# Patient Record
Sex: Female | Born: 1955 | Race: Black or African American | Hispanic: No | Marital: Married | Smoking: Never smoker
Health system: Southern US, Community
[De-identification: ages and names within clinical notes are randomized; demographics above are authoritative.]

## PROBLEM LIST (undated history)

## (undated) DIAGNOSIS — I10 Essential (primary) hypertension: Secondary | ICD-10-CM

## (undated) HISTORY — PX: NO PAST SURGERIES: SHX2092

## (undated) HISTORY — DX: Essential (primary) hypertension: I10

---

## 2016-12-11 ENCOUNTER — Encounter: Payer: Self-pay | Admitting: Nurse Practitioner

## 2016-12-11 ENCOUNTER — Ambulatory Visit (INDEPENDENT_AMBULATORY_CARE_PROVIDER_SITE_OTHER): Payer: BLUE CROSS/BLUE SHIELD | Admitting: Nurse Practitioner

## 2016-12-11 VITALS — BP 150/105 | HR 78 | Temp 98.8°F | Resp 16 | Ht 63.5 in | Wt 178.4 lb

## 2016-12-11 DIAGNOSIS — I10 Essential (primary) hypertension: Secondary | ICD-10-CM

## 2016-12-11 DIAGNOSIS — Z7689 Persons encountering health services in other specified circumstances: Secondary | ICD-10-CM | POA: Diagnosis not present

## 2016-12-11 DIAGNOSIS — M5441 Lumbago with sciatica, right side: Secondary | ICD-10-CM | POA: Diagnosis not present

## 2016-12-11 MED ORDER — AMLODIPINE BESYLATE 5 MG PO TABS: 5.0000 mg | ORAL_TABLET | Freq: Every day | ORAL | 3 refills | Status: DC

## 2016-12-11 MED ORDER — HYDROCHLOROTHIAZIDE 25 MG PO TABS: 25.0000 mg | ORAL_TABLET | Freq: Every day | ORAL | 3 refills | Status: DC

## 2016-12-11 NOTE — Patient Instructions (Signed)
Linda Guzman, Thank you for coming in to clinic today.  1. High blood pressure:   - START hydrochlorothiazide 25 mg once daily. - START amlodipine 5 mg once daily. - Continue eating similarly to the way you did in [place].   Use some tips from the DASH eating plan to help you know what foods might be good for eating if you can't eat your regular diet.  2. For your back pain: - Continue your current treatment for another 4 weeks. - If you continue to have pain, we can consider doing additional X-rays or MRI.   Please schedule a follow-up appointment with Cassell Smiles, AGNP in 4 weeks.  If you have any other questions or concerns, please feel free to call the clinic or send a message through [place]. You may also schedule an earlier appointment if necessary.  Cassell Smiles, DNP, AGNP-BC Adult Gerontology Nurse Practitioner [facility]    DASH Eating Plan DASH stands for "Dietary Approaches to Stop Hypertension." The DASH eating plan is a healthy eating plan that has been shown to reduce high blood pressure (hypertension). It may also reduce your risk for type 2 diabetes, heart disease, and stroke. The DASH eating plan may also help with weight loss. What are tips for following this plan? General guidelines   Avoid eating more than 2,300 mg (milligrams) of salt (sodium) a day. If you have hypertension, you may need to reduce your sodium intake to 1,500 mg a day.  Limit alcohol intake to no more than 1 drink a day for nonpregnant women and 2 drinks a day for men. One drink equals 12 oz of beer, 5 oz of wine, or 1 oz of hard liquor.  Work with your health care provider to maintain a healthy body weight or to lose weight. Ask what an ideal weight is for you.  Get at least 30 minutes of exercise that causes your heart to beat faster (aerobic exercise) most days of the week. Activities may include walking, swimming, or biking.  Work with your health care provider or diet  and nutrition specialist (dietitian) to adjust your eating plan to your individual calorie needs. Reading food labels   Check food labels for the amount of sodium per serving. Choose foods with less than 5 percent of the Daily Value of sodium. Generally, foods with less than 300 mg of sodium per serving fit into this eating plan.  To find whole grains, look for the word "whole" as the first word in the ingredient list. Shopping   Buy products labeled as "low-sodium" or "no salt added."  Buy fresh foods. Avoid canned foods and premade or frozen meals. Cooking   Avoid adding salt when cooking. Use salt-free seasonings or herbs instead of table salt or sea salt. Check with your health care provider or pharmacist before using salt substitutes.  Do not fry foods. Cook foods using healthy methods such as baking, boiling, grilling, and broiling instead.  Cook with heart-healthy oils, such as olive, canola, soybean, or sunflower oil. Meal planning    Eat a balanced diet that includes:  5 or more servings of fruits and vegetables each day. At each meal, try to fill half of your plate with fruits and vegetables.  Up to 6-8 servings of whole grains each day.  Less than 6 oz of lean meat, poultry, or fish each day. A 3-oz serving of meat is about the same size as a deck of cards. One egg equals 1 oz.  2 servings of low-fat dairy each day.  A serving of nuts, seeds, or beans 5 times each week.  Heart-healthy fats. Healthy fats called Omega-3 fatty acids are found in foods such as flaxseeds and coldwater fish, like sardines, salmon, and mackerel.  Limit how much you eat of the following:  Canned or prepackaged foods.  Food that is high in trans fat, such as fried foods.  Food that is high in saturated fat, such as fatty meat.  Sweets, desserts, sugary drinks, and other foods with added sugar.  Full-fat dairy products.  Do not salt foods before eating.  Try to eat at least 2  vegetarian meals each week.  Eat more home-cooked food and less restaurant, buffet, and fast food.  When eating at a restaurant, ask that your food be prepared with less salt or no salt, if possible. What foods are recommended? The items listed may not be a complete list. Talk with your dietitian about what dietary choices are best for you. Grains  Whole-grain or whole-wheat bread. Whole-grain or whole-wheat pasta. Brown rice. [place]. Bulgur. Whole-grain and low-sodium cereals. Pita bread. Low-fat, low-sodium crackers. Whole-wheat flour tortillas. Vegetables  Fresh or frozen vegetables (raw, steamed, roasted, or grilled). Low-sodium or reduced-sodium tomato and vegetable juice. Low-sodium or reduced-sodium tomato sauce and tomato paste. Low-sodium or reduced-sodium canned vegetables. Fruits  All fresh, dried, or frozen fruit. Canned fruit in natural juice (without added sugar). Meat and other protein foods  Skinless chicken or [place]. Ground chicken or [place]. Pork with fat trimmed off. Fish and seafood. Egg whites. Dried beans, peas, or lentils. Unsalted nuts, nut butters, and seeds. Unsalted canned beans. Lean cuts of beef with fat trimmed off. Low-sodium, lean deli meat. Dairy  Low-fat (1%) or fat-free (skim) milk. Fat-free, low-fat, or reduced-fat cheeses. Nonfat, low-sodium ricotta or cottage cheese. Low-fat or nonfat yogurt. Low-fat, low-sodium cheese. Fats and oils  Soft margarine without trans fats. Vegetable oil. Low-fat, reduced-fat, or light mayonnaise and salad dressings (reduced-sodium). Canola, safflower, olive, soybean, and sunflower oils. Avocado. Seasoning and other foods  Herbs. Spices. Seasoning mixes without salt. Unsalted popcorn and pretzels. Fat-free sweets. What foods are not recommended? The items listed may not be a complete list. Talk with your dietitian about what dietary choices are best for you. Grains  Baked goods made with fat, such as croissants,  muffins, or some breads. Dry pasta or rice meal packs. Vegetables  Creamed or fried vegetables. Vegetables in a cheese sauce. Regular canned vegetables (not low-sodium or reduced-sodium). Regular canned tomato sauce and paste (not low-sodium or reduced-sodium). Regular tomato and vegetable juice (not low-sodium or reduced-sodium). Angie Fava. Olives. Fruits  Canned fruit in a light or heavy syrup. Fried fruit. Fruit in cream or butter sauce. Meat and other protein foods  Fatty cuts of meat. Ribs. Fried meat. Berniece Salines. Sausage. Bologna and other processed lunch meats. Salami. Fatback. Hotdogs. Bratwurst. Salted nuts and seeds. Canned beans with added salt. Canned or smoked fish. Whole eggs or egg yolks. Chicken or [place] with skin. Dairy  Whole or 2% milk, cream, and half-and-half. Whole or full-fat cream cheese. Whole-fat or sweetened yogurt. Full-fat cheese. Nondairy creamers. Whipped toppings. Processed cheese and cheese spreads. Fats and oils  Butter. Stick margarine. Lard. Shortening. Ghee. Bacon fat. Tropical oils, such as coconut, palm kernel, or palm oil. Seasoning and other foods  Salted popcorn and pretzels. Onion salt, garlic salt, seasoned salt, table salt, and sea salt. Worcestershire sauce. Tartar sauce. Barbecue sauce. Teriyaki sauce. Soy sauce,  including reduced-sodium. Steak sauce. Canned and packaged gravies. Fish sauce. Oyster sauce. Cocktail sauce. Horseradish that you find on the shelf. Ketchup. Mustard. Meat flavorings and tenderizers. Bouillon cubes. Hot sauce and Tabasco sauce. Premade or packaged marinades. Premade or packaged taco seasonings. Relishes. Regular salad dressings. Where to find more information:  National Heart, Lung, and [place]: https://wilson-eaton.com/  American Heart Association: www.heart.org Summary  The DASH eating plan is a healthy eating plan that has been shown to reduce high blood pressure (hypertension). It may also reduce your risk for type 2  diabetes, heart disease, and stroke.  With the DASH eating plan, you should limit salt (sodium) intake to 2,300 mg a day. If you have hypertension, you may need to reduce your sodium intake to 1,500 mg a day.  When on the DASH eating plan, aim to eat more fresh fruits and vegetables, whole grains, lean proteins, low-fat dairy, and heart-healthy fats.  Work with your health care provider or diet and nutrition specialist (dietitian) to adjust your eating plan to your individual calorie needs. This information is not intended to replace advice given to you by your health care provider. Make sure you discuss any questions you have with your health care provider. Document Released: 07/25/2011 Document Revised: 07/29/2016 Document Reviewed: 07/29/2016 Elsevier Interactive Patient Education  2017 [employer].

## 2016-12-11 NOTE — Progress Notes (Signed)
Subjective:    Patient ID: Linda Guzman, female    DOB: Dec 23, 1955, 61 y.o.   MRN: [id number]  Linda Guzman is a 61 y.o. female presenting on 12/11/2016 for New Patient (Initial Visit) and Hypertension (Patient reports that she has a history of high BP, pt was taking medications in [place]. Patient reports she has been off medications for about 3 months. Patient was taking Amlodipine 10 mg daily and Bendroflomethiazide 2.5 mg daily.)  She is accompanied today by her husband Oti who will translate.  No language line or physical interpreter for patient's native language, [place].  HPI Hypertension Has had a eye doctor's visit and had an elevated BP there.  She had been taking medications in [place], but has not for the last 3 months.  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  In [place], she was taking bendroflumethiazide 2.5 mg daily and amlodipine 10 mg daily.  Some headache at times with higher BP.   sometimes palpitations.  Pt denies headache, lightheadedness, dizziness, changes in vision, chest tightness/pressure, palpitations, sudden loss of speech or loss of consiousness.  She participates in physical activity most of the day and is sedentary only about 1-2 hours per day.    Fall  2 weeks fell from bed. Urgent care in [place] and still has pain in her back and running down her R leg. SHarp, burning and shooting pain.  Gave vitamin D and magnesium for compression of the spine.  Pain is slowly improving, only has shooting pain when standing up after sitting for a while.     Past Medical History:  Diagnosis Date  . Hypertension    History reviewed. No pertinent surgical history. Social History   Social History  . Marital status: Married    Spouse name: N/A  . Number of children: N/A  . Years of education: N/A   Occupational History  . Not on file.   Social History Main Topics  . Smoking status:  Never Smoker  . Smokeless tobacco: Never Used  . Alcohol use No  . Drug use: No  . Sexual activity: Not on file   Other Topics Concern  . Not on file   Social History Narrative  . No narrative on file   History reviewed. No pertinent family history. No current outpatient prescriptions on file prior to visit.   No current facility-administered medications on file prior to visit.     Review of Systems Per HPI unless specifically indicated above     Objective:    BP (!) 157/86 (BP Location: Right Arm, Patient Position: Sitting, Cuff Size: Large)   Pulse 78   Temp 98.8 F (37.1 C)   Resp 16   Ht 5' 3.5" (1.613 m)   Wt 178 lb 6.4 oz (80.9 kg)   SpO2 99%   BMI 31.11 kg/m    Wt Readings from Last 3 Encounters:  12/11/16 178 lb 6.4 oz (80.9 kg)   BP recheck 150/105   Physical Exam  Constitutional: She is oriented to person, place, and time. She appears well-developed and well-nourished. No distress.  HENT:  Head: Normocephalic and atraumatic.  Mouth/Throat: Oropharynx is clear and moist.  Eyes: Conjunctivae are normal. Pupils are equal, round, and reactive to light.  Neck: Normal range of motion. Neck supple. No JVD present. No tracheal deviation present.  No carotid bruit  Cardiovascular: Normal rate, regular rhythm, S1 normal  and S2 normal.  PMI is not displaced.  Exam reveals no gallop.   Murmur heard.  Systolic murmur is present with a grade of 3/6  Holosystolic murmur auscultated over R 2nd intercostal space at midclavicular line  Pulmonary/Chest: Effort normal and breath sounds normal. No respiratory distress.  Musculoskeletal: Normal range of motion.       Lumbar back: She exhibits bony tenderness. She exhibits normal range of motion, no swelling, no edema, no deformity, no laceration and no spasm.  Bony tenderness near L5-S1  Lymphadenopathy:    She has no cervical adenopathy.  Neurological: She is alert and oriented to person, place, and time.  Skin: Skin  is warm and dry.  Psychiatric: She has a normal mood and affect. Her behavior is normal. Judgment and thought content normal.      Assessment & Plan:   Problem List Items Addressed This Visit    None    Visit Diagnoses    Essential hypertension    -  Primary Uncontrolled r/t no access to medications in [place].  Plan: 1. Restart amlodipine at 5 mg take one tablet daily. 2. Restart thiazide diuretic from bendroflumethiazide 2.5 mg daily to hydrochlorothiazide 25 mg once daily for similar BP lowering effect.  HCTZ may provide slightly less lowering effect at this dose.  Recheck in 4 weeks.  Consider transition to chlorthalidone at that time if still uncontrolled BP. 3. CMP today for baseline kidney and liver function and electrolytes.   Relevant Medications   hydrochlorothiazide (HYDRODIURIL) 25 MG tablet   amLODipine (NORVASC) 5 MG tablet   Other Relevant Orders   COMPLETE METABOLIC PANEL WITH GFR   Acute midline low back pain with right-sided sciatica    Slowly improving.  Patient with full ROM.  Only experiences sciatica when standing from a chair after a prolonged period of siting.  Plan: 1. Continue Vitamin D and Magnesium supplements. 2. If pain persists or worsens after another 4 weeks, consider re-imaging.      Encounter to establish care     Patient relocating from [place] to [place].  Known medical history of hypertension.  Plan: 1. Manage HTN and participate in preventive care as needed.      Meds ordered this encounter  Medications  . cholecalciferol (VITAMIN D) 1000 units tablet    Sig: Take 1,000 Units by mouth daily.  . magnesium oxide (MAG-OX) 400 MG tablet    Sig: Take 400 mg by mouth daily.  . hydrochlorothiazide (HYDRODIURIL) 25 MG tablet    Sig: Take 1 tablet (25 mg total) by mouth daily.    Dispense:  90 tablet    Refill:  3    Order Specific Question:   Supervising Provider    Answer:   Olin Hauser [[id number]]  . amLODipine (NORVASC) 5 MG tablet     Sig: Take 1 tablet (5 mg total) by mouth daily.    Dispense:  90 tablet    Refill:  3    Order Specific Question:   Supervising Provider    Answer:   Olin Hauser [[id number]]     Follow up plan: Return in about 4 weeks (around 01/08/2017) for blood pressure - new medicine.  Cassell Smiles, DNP, AGPCNP-BC Adult Gerontology Primary Care Nurse Practitioner [facility] 12/11/2016, 10:32 AM

## 2016-12-11 NOTE — Progress Notes (Signed)
I have reviewed this encounter including the documentation in this note and/or discussed this patient with the provider, Cassell Smiles, AGPCNP-BC. I am certifying that I agree with the content of this note as supervising physician.  Nobie Putnam, [facility] 12/11/2016, 5:35 PM

## 2016-12-12 ENCOUNTER — Telehealth: Payer: Self-pay

## 2016-12-12 LAB — COMPLETE METABOLIC PANEL WITH GFR
AG Ratio: 1.4 Ratio (ref 1.0–2.5)
ALT: 20 U/L (ref 6–29)
AST: 27 U/L (ref 10–35)
Albumin: 4.3 g/dL (ref 3.6–5.1)
Alkaline Phosphatase: 83 U/L (ref 33–130)
BUN/Creatinine Ratio: 15.4 Ratio (ref 6–22)
BUN: 14 mg/dL (ref 7–25)
CO2: 25 mmol/L (ref 20–31)
Calcium: 9.4 mg/dL (ref 8.6–10.4)
Chloride: 104 mmol/L (ref 98–110)
Creat: 0.91 mg/dL (ref 0.50–0.99)
GFR, Est African American: 79 mL/min (ref >=60)
GFR, Est Non African American: 69 mL/min (ref >=60)
Globulin: 3.1 g/dL (ref 1.9–3.7)
Glucose, Bld: 127 mg/dL — ABNORMAL HIGH (ref 65–99)
Potassium: 3.5 mmol/L (ref 3.5–5.3)
Sodium: 141 mmol/L (ref 135–146)
Total Bilirubin: 0.5 mg/dL (ref 0.2–1.2)
Total Protein: 7.4 g/dL (ref 6.1–8.1)

## 2016-12-12 NOTE — Telephone Encounter (Signed)
-----   Message from Mikey College, NP sent at 12/12/2016 11:23 AM EDT ----- Unsure of fasting vs. Non-fasting labs. If fasting labs, glucose is elevated and patient is at high risk for T2DM.  If non-fasting, this value is normal. Kidney function and electrolytes are normal.   At next appointment we will check Hgb A1c for diabetes screening and repeat CMP to reassess kidney function after starting hydrochlorothiazide.

## 2016-12-12 NOTE — Telephone Encounter (Signed)
Patient's husband advised of lab results. Patient verbalizes understanding and is in agreement with treatment plan.  Patient's husband reports that pt was not fasting for labs.

## 2017-01-08 ENCOUNTER — Encounter: Payer: Self-pay | Admitting: Nurse Practitioner

## 2017-01-08 ENCOUNTER — Ambulatory Visit (INDEPENDENT_AMBULATORY_CARE_PROVIDER_SITE_OTHER): Payer: BLUE CROSS/BLUE SHIELD | Admitting: Nurse Practitioner

## 2017-01-08 VITALS — BP 138/88 | HR 74 | Temp 98.7°F | Ht 64.5 in | Wt 175.6 lb

## 2017-01-08 DIAGNOSIS — I10 Essential (primary) hypertension: Secondary | ICD-10-CM | POA: Diagnosis not present

## 2017-01-08 DIAGNOSIS — R739 Hyperglycemia, unspecified: Secondary | ICD-10-CM | POA: Insufficient documentation

## 2017-01-08 LAB — COMPREHENSIVE METABOLIC PANEL
ALT: 19 U/L (ref 6–29)
AST: 21 U/L (ref 10–35)
Albumin: 4.1 g/dL (ref 3.6–5.1)
Alkaline Phosphatase: 84 U/L (ref 33–130)
BUN: 9 mg/dL (ref 7–25)
CO2: 27 mmol/L (ref 20–31)
Calcium: 9.6 mg/dL (ref 8.6–10.4)
Chloride: 102 mmol/L (ref 98–110)
Creat: 0.9 mg/dL (ref 0.50–0.99)
Glucose, Bld: 92 mg/dL (ref 65–99)
Potassium: 3.6 mmol/L (ref 3.5–5.3)
Sodium: 140 mmol/L (ref 135–146)
Total Bilirubin: 0.6 mg/dL (ref 0.2–1.2)
Total Protein: 7.5 g/dL (ref 6.1–8.1)

## 2017-01-08 LAB — LIPID PANEL
Cholesterol: 201 mg/dL — ABNORMAL HIGH (ref <200)
HDL: 64 mg/dL (ref >50)
LDL Cholesterol: 117 mg/dL — ABNORMAL HIGH (ref <100)
Total CHOL/HDL Ratio: 3.1 Ratio (ref <5.0)
Triglycerides: 99 mg/dL (ref <150)
VLDL: 20 mg/dL (ref <30)

## 2017-01-08 LAB — POCT GLYCOSYLATED HEMOGLOBIN (HGB A1C): Hemoglobin A1C: 5.9

## 2017-01-08 MED ORDER — HYDROCHLOROTHIAZIDE 50 MG PO TABS: 50.0000 mg | ORAL_TABLET | Freq: Every day | ORAL | 3 refills | Status: DC

## 2017-01-08 NOTE — Progress Notes (Signed)
Subjective:    Patient ID: [name], female    DOB: [birth year], 61 y.o.   MRN: [id number]  Lourine Alberico is a 61 y.o. female presenting on 01/08/2017 for Hypertension  Ms. Cashaw has improved understanding of English.  Her husband, Oti, is also present in visit and is translating some.  Ms. Brasher is contributing much of her history without language assistance.  [place] is not available by interpreter line or in-person interpreter.  HPI Hypertension At last visit, Ms. Olinde was restarted on amlodipine at 5 mg once daily and changed from bendroflumethiazide 2.5 mg once daily to hydrochlorothiazide 25 mg once daily for similar BP lowering effect.    Since that visit, she admits only occasional palpitations.  Pt denies headache, lightheadedness, dizziness, changes in vision, chest tightness/pressure, leg swelling, sudden loss of speech or loss of consiousness.   Elevated fasting glucose Fasting glucose was 127 on CMP in April 2018.  Pt denies polydipsia, polyphagia, polyuria, headaches, diaphoresis, shakiness, chills, pain, numbness or tingling in extremities, and changes in vision.  She has started adapting well to her life in the [place].  She is still eating oatmeal daily instead of her usual porridge and has transitioned to white potatoes here.  She is preparing most food at home and she and her husband eat many fresh vegetables.    They request information about what foods are good to eat for blood sugar and blood pressure.   Social History  Substance Use Topics  . Smoking status: Never Smoker  . Smokeless tobacco: Never Used  . Alcohol use No    Review of Systems Per HPI unless specifically indicated above     Objective:    BP (!) 142/91   Pulse 74   Temp 98.7 F (37.1 C) (Oral)   Ht 5' 4.5" (1.638 m)   Wt 175 lb 9.6 oz (79.7 kg)   BMI 29.68 kg/m    BP Recheck: 138/88  Wt Readings from Last 3 Encounters:  01/08/17 175 lb 9.6 oz (79.7 kg)  12/11/16 178 lb 6.4 oz (80.9 kg)     Physical Exam  Constitutional: She is oriented to person, place, and time. She appears well-developed and well-nourished. No distress.  HENT:  Head: Normocephalic and atraumatic.  Eyes: Conjunctivae are normal. Pupils are equal, round, and reactive to light.  Neck: Normal range of motion. Neck supple. No JVD present. No tracheal deviation present. No thyromegaly present.  Cardiovascular: Normal rate, regular rhythm, normal heart sounds and intact distal pulses.   Pulmonary/Chest: Effort normal and breath sounds normal.  Abdominal: Soft. Bowel sounds are normal. She exhibits no distension and no mass. There is no tenderness.  Lymphadenopathy:    She has no cervical adenopathy.  Neurological: She is alert and oriented to person, place, and time.  Skin: Skin is warm and dry.  Psychiatric: She has a normal mood and affect. Her behavior is normal. Judgment and thought content normal.  Vitals reviewed.  Results for orders placed or performed in visit on 01/08/17  Comprehensive metabolic panel  Result Value Ref Range   Sodium 140 135 - 146 mmol/L   Potassium 3.6 3.5 - 5.3 mmol/L   Chloride 102 98 - 110 mmol/L   CO2 27 20 - 31 mmol/L   Glucose, Bld 92 65 - 99 mg/dL   BUN 9 7 - 25 mg/dL   Creat 0.90 0.50 - 0.99 mg/dL   Total Bilirubin 0.6 0.2 - 1.2 mg/dL   Alkaline Phosphatase 84 33 -  130 U/L   AST 21 10 - 35 U/L   ALT 19 6 - 29 U/L   Total Protein 7.5 6.1 - 8.1 g/dL   Albumin 4.1 3.6 - 5.1 g/dL   Calcium 9.6 8.6 - 10.4 mg/dL  Lipid panel  Result Value Ref Range   Cholesterol 201 (H) <200 mg/dL   Triglycerides 99 <150 mg/dL   HDL 64 >50 mg/dL   Total CHOL/HDL Ratio 3.1 <5.0 Ratio   VLDL 20 <30 mg/dL   LDL Cholesterol 117 (H) <100 mg/dL  POCT glycosylated hemoglobin (Hb A1C)  Result Value Ref Range   Hemoglobin A1C 5.9       Assessment & Plan:   Problem List Items Addressed This Visit      Cardiovascular and Mediastinum   Essential hypertension - Primary    BP improved  after restarting medications.  Not yet at goal.  BP elevated in clinic.  Plan: 1. Increase hydrochlorothiazide to 50 mg once daily. 2. CONTINUE amlodipine 5 mg once daily. 3. Recheck CMP on medications.  Normal kidney and liver function without change on medications. 4. Check lipid panel assess cardiovascular risk.       Relevant Medications   hydrochlorothiazide (HYDRODIURIL) 50 MG tablet   Other Relevant Orders   Comprehensive metabolic panel (Completed)   Lipid panel (Completed)     Other   Blood glucose elevated    Single elevated blood glucose reading at last lab check.  Pt without symptoms of hyperglycemia.  Plan: 1. POCT hgb A1c today.   2. Check labs for cmp and lipids. 3. Discussed diet, dietary fiber, and glycemic index.  Encouraged high fiber and return to yams/sweet potatoes instead of white potatoes, smaller bowls of oatmeal. Provided handouts for dietary fiber and glycemic index for review.      Relevant Orders   Comprehensive metabolic panel (Completed)   Lipid panel (Completed)   POCT glycosylated hemoglobin (Hb A1C) (Completed)      Meds ordered this encounter  Medications  . hydrochlorothiazide (HYDRODIURIL) 50 MG tablet    Sig: Take 1 tablet (50 mg total) by mouth daily.    Dispense:  90 tablet    Refill:  3    Order Specific Question:   Supervising Provider    Answer:   Olin Hauser [[id number]]     Follow up plan: Return in about 3 months (around 04/10/2017) for hypertension and in 2 weeks for CMA BP check.   Cassell Smiles, DNP, AGPCNP-BC Adult Gerontology Primary Care Nurse Practitioner [facility] 01/08/2017, 8:02 AM

## 2017-01-08 NOTE — Patient Instructions (Addendum)
[facility], Thank you for coming in to clinic today.  1. Your blood pressures look much better today!  Great work! - We are still above goal.  -START taking hydrochlorothiazide 50 mg once daily.  Until you refill your medicine, you can take 2 pills once each day.  Your new refill will be the correct strength, so only take 1 pill. -CONTINUE your amlodipine 5 mg once daily.  2. For your elevated blood sugars tell me you are at risk for developing diabetes. - Your A1c is 5.9% today.  This means your average blood sugar is 133.  We want it less than 120 for fasting blood sugars and yours was 127 at your last check.  If you eat more dietary fiber, less sugar and refined carbohydrates, and stay active with exercise, you can lower your risk of developing diabetes. - Work to lose 1/2 lb to 1 lb per week.  For a total of 5-10 lbs of weight loss.   Call and ask your insurance company if nutrition visit is covered.  They may pay for it with elevated glucose readings or high blood pressure  Please schedule a follow-up appointment with Cassell Smiles, AGNP to Return in about 3 months (around 04/10/2017) for hypertension and in 2 weeks for CMA BP check.  If you have any other questions or concerns, please feel free to call the clinic or send a message through [place]. You may also schedule an earlier appointment if necessary.  Cassell Smiles, DNP, AGNP-BC Adult Gerontology Nurse Practitioner [facility], [facility]    DASH Eating Plan DASH stands for "Dietary Approaches to Stop Hypertension." The DASH eating plan is a healthy eating plan that has been shown to reduce high blood pressure (hypertension). It may also reduce your risk for type 2 diabetes, heart disease, and stroke. The DASH eating plan may also help with weight loss. What are tips for following this plan? General guidelines   Avoid eating more than 2,300 mg (milligrams) of salt (sodium) a day. If you have hypertension, you may need to  reduce your sodium intake to 1,500 mg a day.  Limit alcohol intake to no more than 1 drink a day for nonpregnant women and 2 drinks a day for men. One drink equals 12 oz of beer, 5 oz of wine, or 1 oz of hard liquor.  Work with your health care provider to maintain a healthy body weight or to lose weight. Ask what an ideal weight is for you.  Get at least 30 minutes of exercise that causes your heart to beat faster (aerobic exercise) most days of the week. Activities may include walking, swimming, or biking.  Work with your health care provider or diet and nutrition specialist (dietitian) to adjust your eating plan to your individual calorie needs. Reading food labels   Check food labels for the amount of sodium per serving. Choose foods with less than 5 percent of the Daily Value of sodium. Generally, foods with less than 300 mg of sodium per serving fit into this eating plan.  To find whole grains, look for the word "whole" as the first word in the ingredient list. Shopping   Buy products labeled as "low-sodium" or "no salt added."  Buy fresh foods. Avoid canned foods and premade or frozen meals. Cooking   Avoid adding salt when cooking. Use salt-free seasonings or herbs instead of table salt or sea salt. Check with your health care provider or pharmacist before using salt substitutes.  Do  not fry foods. Cook foods using healthy methods such as baking, boiling, grilling, and broiling instead.  Cook with heart-healthy oils, such as olive, canola, soybean, or sunflower oil. Meal planning    Eat a balanced diet that includes:  5 or more servings of fruits and vegetables each day. At each meal, try to fill half of your plate with fruits and vegetables.  Up to 6-8 servings of whole grains each day.  Less than 6 oz of lean meat, poultry, or fish each day. A 3-oz serving of meat is about the same size as a deck of cards. One egg equals 1 oz.  2 servings of low-fat dairy each  day.  A serving of nuts, seeds, or beans 5 times each week.  Heart-healthy fats. Healthy fats called Omega-3 fatty acids are found in foods such as flaxseeds and coldwater fish, like sardines, salmon, and mackerel.  Limit how much you eat of the following:  Canned or prepackaged foods.  Food that is high in trans fat, such as fried foods.  Food that is high in saturated fat, such as fatty meat.  Sweets, desserts, sugary drinks, and other foods with added sugar.  Full-fat dairy products.  Do not salt foods before eating.  Try to eat at least 2 vegetarian meals each week.  Eat more home-cooked food and less restaurant, buffet, and fast food.  When eating at a restaurant, ask that your food be prepared with less salt or no salt, if possible. What foods are recommended? The items listed may not be a complete list. Talk with your dietitian about what dietary choices are best for you. Grains  Whole-grain or whole-wheat bread. Whole-grain or whole-wheat pasta. Brown rice. [place]. Bulgur. Whole-grain and low-sodium cereals. Pita bread. Low-fat, low-sodium crackers. Whole-wheat flour tortillas. Vegetables  Fresh or frozen vegetables (raw, steamed, roasted, or grilled). Low-sodium or reduced-sodium tomato and vegetable juice. Low-sodium or reduced-sodium tomato sauce and tomato paste. Low-sodium or reduced-sodium canned vegetables. Fruits  All fresh, dried, or frozen fruit. Canned fruit in natural juice (without added sugar). Meat and other protein foods  Skinless chicken or [place]. Ground chicken or [place]. Pork with fat trimmed off. Fish and seafood. Egg whites. Dried beans, peas, or lentils. Unsalted nuts, nut butters, and seeds. Unsalted canned beans. Lean cuts of beef with fat trimmed off. Low-sodium, lean deli meat. Dairy  Low-fat (1%) or fat-free (skim) milk. Fat-free, low-fat, or reduced-fat cheeses. Nonfat, low-sodium ricotta or cottage cheese. Low-fat or nonfat yogurt.  Low-fat, low-sodium cheese. Fats and oils  Soft margarine without trans fats. Vegetable oil. Low-fat, reduced-fat, or light mayonnaise and salad dressings (reduced-sodium). Canola, safflower, olive, soybean, and sunflower oils. Avocado. Seasoning and other foods  Herbs. Spices. Seasoning mixes without salt. Unsalted popcorn and pretzels. Fat-free sweets. What foods are not recommended? The items listed may not be a complete list. Talk with your dietitian about what dietary choices are best for you. Grains  Baked goods made with fat, such as croissants, muffins, or some breads. Dry pasta or rice meal packs. Vegetables  Creamed or fried vegetables. Vegetables in a cheese sauce. Regular canned vegetables (not low-sodium or reduced-sodium). Regular canned tomato sauce and paste (not low-sodium or reduced-sodium). Regular tomato and vegetable juice (not low-sodium or reduced-sodium). Angie Fava. Olives. Fruits  Canned fruit in a light or heavy syrup. Fried fruit. Fruit in cream or butter sauce. Meat and other protein foods  Fatty cuts of meat. Ribs. Fried meat. Berniece Salines. Sausage. Bologna and other processed lunch  meats. Salami. Fatback. Hotdogs. Bratwurst. Salted nuts and seeds. Canned beans with added salt. Canned or smoked fish. Whole eggs or egg yolks. Chicken or [place] with skin. Dairy  Whole or 2% milk, cream, and half-and-half. Whole or full-fat cream cheese. Whole-fat or sweetened yogurt. Full-fat cheese. Nondairy creamers. Whipped toppings. Processed cheese and cheese spreads. Fats and oils  Butter. Stick margarine. Lard. Shortening. Ghee. Bacon fat. Tropical oils, such as coconut, palm kernel, or palm oil. Seasoning and other foods  Salted popcorn and pretzels. Onion salt, garlic salt, seasoned salt, table salt, and sea salt. Worcestershire sauce. Tartar sauce. Barbecue sauce. Teriyaki sauce. Soy sauce, including reduced-sodium. Steak sauce. Canned and packaged gravies. Fish sauce. Oyster sauce.  Cocktail sauce. Horseradish that you find on the shelf. Ketchup. Mustard. Meat flavorings and tenderizers. Bouillon cubes. Hot sauce and Tabasco sauce. Premade or packaged marinades. Premade or packaged taco seasonings. Relishes. Regular salad dressings. Where to find more information:  National Heart, Lung, and [place]: https://wilson-eaton.com/  American Heart Association: www.heart.org Summary  The DASH eating plan is a healthy eating plan that has been shown to reduce high blood pressure (hypertension). It may also reduce your risk for type 2 diabetes, heart disease, and stroke.  With the DASH eating plan, you should limit salt (sodium) intake to 2,300 mg a day. If you have hypertension, you may need to reduce your sodium intake to 1,500 mg a day.  When on the DASH eating plan, aim to eat more fresh fruits and vegetables, whole grains, lean proteins, low-fat dairy, and heart-healthy fats.  Work with your health care provider or diet and nutrition specialist (dietitian) to adjust your eating plan to your individual calorie needs. This information is not intended to replace advice given to you by your health care provider. Make sure you discuss any questions you have with your health care provider. Document Released: 07/25/2011 Document Revised: 07/29/2016 Document Reviewed: 07/29/2016 Elsevier Interactive Patient Education  2017 [place].    High-Fiber Diet Fiber, also called dietary fiber, is a type of carbohydrate found in fruits, vegetables, whole grains, and beans. A high-fiber diet can have many health benefits. Your health care provider may recommend a high-fiber diet to help:  Prevent constipation. Fiber can make your bowel movements more regular.  Lower your cholesterol.  Relieve hemorrhoids, uncomplicated diverticulosis, or irritable bowel syndrome.  Prevent overeating as part of a weight-loss plan.  Prevent heart disease, type 2 diabetes, and certain cancers. What  is my plan? The recommended daily intake of fiber includes:  38 grams for men under age 63.  9 grams for men over age 21.  39 grams for women under age 31.  35 grams for women over age 20. You can get the recommended daily intake of dietary fiber by eating a variety of fruits, vegetables, grains, and beans. Your health care provider may also recommend a fiber supplement if it is not possible to get enough fiber through your diet. What do I need to know about a high-fiber diet?  Fiber supplements have not been widely studied for their effectiveness, so it is better to get fiber through food sources.  Always check the fiber content on thenutrition facts label of any prepackaged food. Look for foods that contain at least 5 grams of fiber per serving.  Ask your dietitian if you have questions about specific foods that are related to your condition, especially if those foods are not listed in the following section.  Increase your daily fiber consumption  gradually. Increasing your intake of dietary fiber too quickly may cause bloating, cramping, or gas.  Drink plenty of water. Water helps you to digest fiber. What foods can I eat? Grains  Whole-grain breads. Multigrain cereal. Oats and oatmeal. Brown rice. Barley. Bulgur wheat. Solis. Bran muffins. Popcorn. Rye wafer crackers. Vegetables  Sweet potatoes. Spinach. Kale. Artichokes. Cabbage. Broccoli. Green peas. Carrots. Squash. Fruits  Berries. Pears. Apples. Oranges. Avocados. Prunes and raisins. Dried figs. Meats and Other Protein Sources  [place], kidney, pinto, and soy beans. Split peas. Lentils. Nuts and seeds. Dairy  Fiber-fortified yogurt. Beverages  Fiber-fortified soy milk. Fiber-fortified orange juice. Other  Fiber bars. The items listed above may not be a complete list of recommended foods or beverages. Contact your dietitian for more options.  What foods are not recommended? Grains  White bread. Pasta made with refined  flour. White rice. Vegetables  Fried potatoes. Canned vegetables. Well-cooked vegetables. Fruits  Fruit juice. Cooked, strained fruit. Meats and Other Protein Sources  Fatty cuts of meat. Fried Sales executive or fried fish. Dairy  Milk. Yogurt. Cream cheese. Sour cream. Beverages  Soft drinks. Other  Cakes and pastries. Butter and oils. The items listed above may not be a complete list of foods and beverages to avoid. Contact your dietitian for more information.  What are some tips for including high-fiber foods in my diet?  Eat a wide variety of high-fiber foods.  Make sure that half of all grains consumed each day are whole grains.  Replace breads and cereals made from refined flour or white flour with whole-grain breads and cereals.  Replace white rice with brown rice, quinoa, bulgur wheat, farro, or millet.  Start the day with a breakfast that is high in fiber, such as a cereal that contains at least 5 grams of fiber per serving.  Use beans in place of meat in soups, salads, or pasta.  Eat high-fiber snacks, such as berries, raw vegetables, nuts, or popcorn. This information is not intended to replace advice given to you by your health care provider. Make sure you discuss any questions you have with your health care provider. Document Released: 08/05/2005 Document Revised: 01/11/2016 Document Reviewed: 01/18/2014 Elsevier Interactive Patient Education  2017 [employer].

## 2017-01-09 NOTE — Assessment & Plan Note (Addendum)
Single elevated blood glucose reading at last lab check.  Pt without symptoms of hyperglycemia.  Plan: 1. POCT hgb A1c today.   2. Check labs for cmp and lipids. 3. Discussed diet, dietary fiber, and glycemic index.  Encouraged high fiber and return to yams/sweet potatoes instead of white potatoes, smaller bowls of oatmeal. Provided handouts for dietary fiber and glycemic index for review.

## 2017-01-09 NOTE — Assessment & Plan Note (Signed)
BP improved after restarting medications.  Not yet at goal.  BP elevated in clinic.  Plan: 1. Increase hydrochlorothiazide to 50 mg once daily. 2. CONTINUE amlodipine 5 mg once daily. 3. Recheck CMP on medications.  Normal kidney and liver function without change on medications. 4. Check lipid panel assess cardiovascular risk.

## 2017-01-10 NOTE — Progress Notes (Signed)
I have reviewed this encounter including the documentation in this note and/or discussed this patient with the provider, Cassell Smiles, AGPCNP-BC. I am certifying that I agree with the content of this note as supervising physician.  Nobie Putnam, DO [facility] 01/10/2017, 10:46 AM

## 2017-01-22 ENCOUNTER — Ambulatory Visit: Payer: BLUE CROSS/BLUE SHIELD

## 2017-01-22 VITALS — BP 144/84 | HR 70 | Resp 16 | Ht 64.5 in | Wt 175.0 lb

## 2017-01-22 DIAGNOSIS — I10 Essential (primary) hypertension: Secondary | ICD-10-CM

## 2017-03-03 ENCOUNTER — Ambulatory Visit: Payer: BLUE CROSS/BLUE SHIELD | Admitting: Nurse Practitioner

## 2017-03-03 ENCOUNTER — Encounter: Payer: Self-pay | Admitting: Nurse Practitioner

## 2017-03-03 ENCOUNTER — Ambulatory Visit (INDEPENDENT_AMBULATORY_CARE_PROVIDER_SITE_OTHER): Payer: BLUE CROSS/BLUE SHIELD | Admitting: Nurse Practitioner

## 2017-03-03 VITALS — BP 112/77 | HR 72 | Ht 64.5 in | Wt 174.8 lb

## 2017-03-03 DIAGNOSIS — I10 Essential (primary) hypertension: Secondary | ICD-10-CM | POA: Diagnosis not present

## 2017-03-03 DIAGNOSIS — R0789 Other chest pain: Secondary | ICD-10-CM | POA: Diagnosis not present

## 2017-03-03 NOTE — Assessment & Plan Note (Signed)
Improved and at goal. Pt is not checking BP at home, but is taking medications as prescribed w/o side effects.  Plan: 1. CONTINUE hydrochlorothiazide 50 mg once daily. 2. CONTINUE amlodipine 5 mg once daily. 3. Continue DASH eating plan and regular physical activity. 4. Follow up 3 months.

## 2017-03-03 NOTE — Progress Notes (Signed)
I have reviewed this encounter including the documentation in this note and/or discussed this patient with the provider, Cassell Smiles, AGPCNP-BC. I am certifying that I agree with the content of this note as supervising physician.  Nobie Putnam, [facility] 03/03/2017, 1:04 PM

## 2017-03-03 NOTE — Patient Instructions (Addendum)
Linda Guzman, Thank you for coming in to clinic today.  1. For your chest wall pain: - Start taking acetaminophen or Tylenol extra strength 1 to 2 tablets every 8 hours for aches.  Do not take more than 3,000 mg in 24 hours from all medicines.   - Use heat and ice.  Apply this for 15 minutes at a time 6-8 times per day.   - Muscle rub with lidocaine.  Avoid using this with heat and ice to avoid burns. Brands: BenGay, Aspercreme or a drugstore generic   2. For your blood pressure:  GREAT WORK!! - Continue your DASH diet. - Continue taking your medications.  Please schedule a follow-up appointment with Cassell Smiles, AGNP to Return in about 3 months (around 06/03/2017) for Blood pressure and in 1-2 weeks as needed for muscle pain.  If you have any other questions or concerns, please feel free to call the clinic or send a message through [place]. You may also schedule an earlier appointment if necessary.  Cassell Smiles, DNP, AGNP-BC Adult Gerontology Nurse Practitioner [facility], [facility]    Chest Wall Pain Chest wall pain is pain in or around the bones and muscles of your chest. Sometimes, an injury causes this pain. Sometimes, the cause may not be known. This pain may take several weeks or longer to get better. Follow these instructions at home: Pay attention to any changes in your symptoms. Take these actions to help with your pain:  Rest as told by your health care provider.  Avoid activities that cause pain. These include any activities that use your chest muscles or your abdominal and side muscles to lift heavy items.  If directed, apply ice to the painful area: ? Put ice in a plastic bag. ? Place a towel between your skin and the bag. ? Leave the ice on for 20 minutes, 2-3 times per day.  Take over-the-counter and prescription medicines only as told by your health care provider.  Do not use tobacco products, including cigarettes, chewing tobacco, and e-cigarettes.  If you need help quitting, ask your health care provider.  Keep all follow-up visits as told by your health care provider. This is important.  Contact a health care provider if:  You have a fever.  Your chest pain becomes worse.  You have new symptoms. Get help right away if:  You have nausea or vomiting.  You feel sweaty or light-headed.  You have a cough with phlegm (sputum) or you cough up blood.  You develop shortness of breath. This information is not intended to replace advice given to you by your health care provider. Make sure you discuss any questions you have with your health care provider. Document Released: 08/05/2005 Document Revised: 12/14/2015 Document Reviewed: 10/31/2014 Elsevier Interactive Patient Education  2017 [employer].

## 2017-03-03 NOTE — Progress Notes (Signed)
Subjective:    Patient ID: Linda Guzman, female    DOB: [birth year], 61 y.o.   MRN: [id number]  Linda Guzman is a 61 y.o. female presenting on 03/03/2017 for Chest Pain (intermittent pt states the pain is in the sternum area, pain worsens with certain movement x 1.5 weeks )  Pt is accompanied today by her husband Oti.  He is fluent in [place].  Pt is now much more fluent in [place] and declines telephone interpreter services.  HPI Sternum Pain Pain started 1.5 weeks ago.  Has been doing heavy lifting of flooring.  NO injury at onset of pain, no recall of injury prior to pain onset.  NO pain at rest.  W/ manual palpation/pressure is 6-8/10.  Pain is reproducible w/ bending forward,  Not w/ stretching back, lifting or twisting.  Has not taken any pain medications or applied any heat/ice. Pt denies syncope or near syncope, chest pressure, shortness of breath, back pain, arm pain or numbness, and jaw pain.   Hypertension Has been following DASH diet and taking meds as prescribed.  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  She is not checking BP at home.     Current medications: amlodipine 5 mg once daily, hydrochlorothiazide 50 mg once daily, tolerating well without side effects  Pt denies headache, lightheadedness, dizziness, changes in vision, chest tightness/pressure, palpitations, leg swelling, sudden loss of speech or loss of consciousness.   Social History  Substance Use Topics  . Smoking status: Never Smoker  . Smokeless tobacco: Never Used  . Alcohol use No    Review of Systems Per HPI unless specifically indicated above     Objective:    BP 112/77 (BP Location: Right Arm, Patient Position: Sitting, Cuff Size: Large)   Pulse 72   Ht 5' 4.5" (1.638 m)   Wt 174 lb 12.8 oz (79.3 kg)   BMI 29.54 kg/m    Wt Readings from Last 3 Encounters:  03/03/17 174 lb 12.8 oz (79.3 kg)  01/22/17 175 lb (79.4 kg)    01/08/17 175 lb 9.6 oz (79.7 kg)    Physical Exam General - Overweight, well-appearing, NAD HEENT - Normocephalic, atraumatic Neck - supple, non-tender, no LAD, no thyromegaly, no carotid bruit Heart - RRR, no murmurs heard Lungs - Clear throughout all lobes, no wheezing, crackles, or rhonchi. Normal work of breathing. Musculoskeletal - Chest Wall Inspection: symmetrical, no edema, no erythema Palpation: tender to palpation approximately 2 cm left of sternum at 5-6th intercostal space  Nontender over remainder of chest wall. ROM: full AROM.  Pain elicited with forward bend.  No pain elicited w/ extension of thoracic spine, twisting. Special Testing: none Strength: normal strength of arms Neurovascular: normal sensation Extremeties - non-tender, no edema, cap refill < 2 seconds, peripheral pulses intact +2 bilaterally Skin - warm, dry Neuro - awake, alert, oriented x3, normal gait Psych - Normal mood and affect, normal behavior     Results for orders placed or performed in visit on 01/08/17  Comprehensive metabolic panel  Result Value Ref Range   Sodium 140 135 - 146 mmol/L   Potassium 3.6 3.5 - 5.3 mmol/L   Chloride 102 98 - 110 mmol/L   CO2 27 20 - 31 mmol/L   Glucose, Bld 92 65 - 99 mg/dL   BUN 9 7 - 25 mg/dL   Creat 0.90 0.50 - 0.99 mg/dL   Total Bilirubin 0.6 0.2 - 1.2 mg/dL  Alkaline Phosphatase 84 33 - 130 U/L   AST 21 10 - 35 U/L   ALT 19 6 - 29 U/L   Total Protein 7.5 6.1 - 8.1 g/dL   Albumin 4.1 3.6 - 5.1 g/dL   Calcium 9.6 8.6 - 10.4 mg/dL  Lipid panel  Result Value Ref Range   Cholesterol 201 (H) <200 mg/dL   Triglycerides 99 <150 mg/dL   HDL 64 >50 mg/dL   Total CHOL/HDL Ratio 3.1 <5.0 Ratio   VLDL 20 <30 mg/dL   LDL Cholesterol 117 (H) <100 mg/dL  POCT glycosylated hemoglobin (Hb A1C)  Result Value Ref Range   Hemoglobin A1C 5.9       Assessment & Plan:   Problem List Items Addressed This Visit      Cardiovascular and Mediastinum   Essential  hypertension    Improved and at goal. Pt is not checking BP at home, but is taking medications as prescribed w/o side effects.  Plan: 1. CONTINUE hydrochlorothiazide 50 mg once daily. 2. CONTINUE amlodipine 5 mg once daily. 3. Continue DASH eating plan and regular physical activity. 4. Follow up 3 months.        Other Visit Diagnoses    Left-sided chest wall pain    -  Primary Musculoskeletal chest wall pain reproducible pain w/ palpation and once specific movement (bending forward).  Pt does not desire to have ECG performed today.  Pain likely self-limited.  Muscle strain possible complicated by lifting heavy objects and other manual labor.  Plan:  1. Treat with acetaminophen.  Discussed max dosing.  Avoid NSAIDs w/ hypertension. 2. Apply heat and/or ice to affected area. 3. May also apply a muscle rub with lidocaine after heat or ice. 4. Use proper body mechanics.  Increase upper body strength w/ exercise to build muscle. Demonstrated wall pushup as initial exercise to try. 5. Follow up 2-4 weeks if no improvement.         Follow up plan: Return in about 3 months (around 06/03/2017) for Blood pressure and in 1-2 weeks as needed for muscle pain.  Cassell Smiles, DNP, AGPCNP-BC Adult Gerontology Primary Care Nurse Practitioner [facility] 03/03/2017, 12:40 PM

## 2017-04-10 ENCOUNTER — Ambulatory Visit: Payer: BLUE CROSS/BLUE SHIELD | Admitting: Nurse Practitioner

## 2017-06-03 ENCOUNTER — Encounter: Payer: Self-pay | Admitting: Nurse Practitioner

## 2017-06-03 ENCOUNTER — Ambulatory Visit (INDEPENDENT_AMBULATORY_CARE_PROVIDER_SITE_OTHER): Payer: BLUE CROSS/BLUE SHIELD | Admitting: Nurse Practitioner

## 2017-06-03 VITALS — BP 141/87 | HR 74 | Temp 98.7°F | Ht 64.5 in | Wt 178.4 lb

## 2017-06-03 DIAGNOSIS — Z23 Encounter for immunization: Secondary | ICD-10-CM

## 2017-06-03 DIAGNOSIS — I10 Essential (primary) hypertension: Secondary | ICD-10-CM | POA: Diagnosis not present

## 2017-06-03 NOTE — Patient Instructions (Addendum)
[name], Thank you for coming in to clinic today.  1. For your blood pressure: - Great work with management.  Work hard to take your medications every day.  You can take your medicine with or without food.  You can also take it later if your remember.    - Come to the clinic for a blood pressure reading only next week.  Please schedule a follow-up appointment with Cassell Smiles, AGNP. Return in about 6 months (around 12/02/2017) for Blood Pressure.  If you have any other questions or concerns, please feel free to call the clinic or send a message through [place]. You may also schedule an earlier appointment if necessary.  You will receive a survey after today's visit either digitally by e-mail or paper by [employer]. Your experiences and feedback matter to [place].  Please respond so we know how we are doing as we provide care for you.   Cassell Smiles, DNP, AGNP-BC Adult Gerontology Nurse Practitioner [place]

## 2017-06-03 NOTE — Assessment & Plan Note (Addendum)
Above goal today, but pt has been without medications for 2 days.  Her belief was they had to be taken in am and with food or not taken that day.  BP was previously improved and at goal, so will wait for medication adjustment until BP recheck on medications. Pt usually takes medications as prescribed w/o side effects.  Plan: 1. Reviewed medication administration - can be taken w/ or w/o food and can be taken later in day as late as dinner time if forgotten.   - CONTINUE hydrochlorothiazide 50 mg once daily. - CONTINUE amlodipine 5 mg once daily. If BP not at goal next visit with regular medications, will increase to 10 mg once daily. 2. Continue DASH eating plan and regular physical activity. 3. Recheck BP in 1 week in clinic. 4. Labs within normal limits at last check in May. 5. Follow up 6 months and in 3 months for annual physical.

## 2017-06-03 NOTE — Progress Notes (Signed)
Subjective:    Patient ID: Linda Guzman, female    DOB: [birth year], 61 y.o.   MRN: [id number]  Linda Guzman is a 61 y.o. female presenting on 06/03/2017 for Hypertension   HPI Hypertension - She is not checking BP at home or outside of clinic.    - Current medications: has been two days without medication (Monday and Today). amlodipine 5 mg once daily, hydrochlorothiazide 50 mg once daily, tolerating well without side effects - She is not currently symptomatic. - Pt denies headache, lightheadedness, dizziness, changes in vision, chest tightness/pressure, palpitations, leg swelling, sudden loss of speech or loss of consciousness. - She  reports no regular exercise routine. Outside of work or home. - Her diet is moderate in salt, moderate in fat, and moderate in carbohydrates.   Social History  Substance Use Topics  . Smoking status: Never Smoker  . Smokeless tobacco: Never Used  . Alcohol use No    Review of Systems Per HPI unless specifically indicated above     Objective:    BP (!) 141/87 (BP Location: Right Arm, Patient Position: Sitting, Cuff Size: Normal)   Pulse 74   Temp 98.7 F (37.1 C) (Oral)   Ht 5' 4.5" (1.638 m)   Wt 178 lb 6.4 oz (80.9 kg)   BMI 30.15 kg/m   Wt Readings from Last 3 Encounters:  06/03/17 178 lb 6.4 oz (80.9 kg)  03/03/17 174 lb 12.8 oz (79.3 kg)  01/22/17 175 lb (79.4 kg)    Physical Exam  General - overweight, well-appearing, NAD HEENT - Normocephalic, atraumatic Neck - supple, non-tender, no LAD, no thyromegaly, no carotid bruit Heart - RRR, no murmurs heard Lungs - Clear throughout all lobes, no wheezing, crackles, or rhonchi. Normal work of breathing. Extremeties - non-tender, no edema, cap refill < 2 seconds, peripheral pulses intact +2 bilaterally Skin - warm, dry Neuro - awake, alert, oriented x3, normal gait Psych - Normal mood and affect, normal behavior    Results for orders placed or performed in visit on 01/08/17    Comprehensive metabolic panel  Result Value Ref Range   Sodium 140 135 - 146 mmol/L   Potassium 3.6 3.5 - 5.3 mmol/L   Chloride 102 98 - 110 mmol/L   CO2 27 20 - 31 mmol/L   Glucose, Bld 92 65 - 99 mg/dL   BUN 9 7 - 25 mg/dL   Creat 0.90 0.50 - 0.99 mg/dL   Total Bilirubin 0.6 0.2 - 1.2 mg/dL   Alkaline Phosphatase 84 33 - 130 U/L   AST 21 10 - 35 U/L   ALT 19 6 - 29 U/L   Total Protein 7.5 6.1 - 8.1 g/dL   Albumin 4.1 3.6 - 5.1 g/dL   Calcium 9.6 8.6 - 10.4 mg/dL  Lipid panel  Result Value Ref Range   Cholesterol 201 (H) <200 mg/dL   Triglycerides 99 <150 mg/dL   HDL 64 >50 mg/dL   Total CHOL/HDL Ratio 3.1 <5.0 Ratio   VLDL 20 <30 mg/dL   LDL Cholesterol 117 (H) <100 mg/dL  POCT glycosylated hemoglobin (Hb A1C)  Result Value Ref Range   Hemoglobin A1C 5.9       Assessment & Plan:   Problem List Items Addressed This Visit      Cardiovascular and Mediastinum   Essential hypertension    Above goal today, but pt has been without medications for 2 days.  Her belief was they had to be taken in am and with  food or not taken that day.  BP was previously improved and at goal, so will wait for medication adjustment until BP recheck on medications. Pt usually takes medications as prescribed w/o side effects.  Plan: 1. Reviewed medication administration - can be taken w/ or w/o food and can be taken later in day as late as dinner time if forgotten.   - CONTINUE hydrochlorothiazide 50 mg once daily. - CONTINUE amlodipine 5 mg once daily. If BP not at goal next visit with regular medications, will increase to 10 mg once daily. 2. Continue DASH eating plan and regular physical activity. 3. Recheck BP in 1 week in clinic. 4. Labs within normal limits at last check in May. 5. Follow up 6 months and in 3 months for annual physical.       Other Visit Diagnoses    Influenza vaccine needed    -  Primary   age < 59. Needs flu vaccine.  Will administer quadrivalent flu today.    Relevant Orders   Flu Vaccine QUAD 6+ mos PF IM (Fluarix Quad PF)      No orders of the defined types were placed in this encounter.   Follow up plan: Return in about 6 months (around 12/02/2017) for Blood Pressure AND an annual physical in the next 3 months.   Cassell Smiles, DNP, AGPCNP-BC Adult Gerontology Primary Care Nurse Practitioner [facility] 06/03/2017, 8:43 AM

## 2017-06-09 ENCOUNTER — Other Ambulatory Visit: Payer: Self-pay | Admitting: Nurse Practitioner

## 2017-06-09 ENCOUNTER — Ambulatory Visit: Payer: BLUE CROSS/BLUE SHIELD

## 2017-06-09 VITALS — BP 141/84 | HR 83

## 2017-06-09 DIAGNOSIS — I10 Essential (primary) hypertension: Secondary | ICD-10-CM

## 2017-06-09 MED ORDER — AMLODIPINE BESYLATE 10 MG PO TABS: 10.0000 mg | ORAL_TABLET | Freq: Every day | ORAL | 1 refills | Status: DC

## 2017-07-01 ENCOUNTER — Telehealth: Payer: Self-pay | Admitting: Nurse Practitioner

## 2017-07-01 ENCOUNTER — Other Ambulatory Visit: Payer: Self-pay

## 2017-07-01 DIAGNOSIS — I10 Essential (primary) hypertension: Secondary | ICD-10-CM

## 2017-07-01 MED ORDER — HYDROCHLOROTHIAZIDE 50 MG PO TABS: 50.0000 mg | ORAL_TABLET | Freq: Every day | ORAL | 3 refills | Status: DC

## 2017-07-01 NOTE — Telephone Encounter (Signed)
PT.  REQUESTING A REFILL ON HYDROCHLOROTHIAZIDE 50 MG   [facility]  IN  [place].  PT CALL BACK  # IS [phone]

## 2017-09-29 ENCOUNTER — Other Ambulatory Visit: Payer: Self-pay

## 2017-09-29 DIAGNOSIS — I10 Essential (primary) hypertension: Secondary | ICD-10-CM

## 2017-09-29 MED ORDER — AMLODIPINE BESYLATE 10 MG PO TABS: 10.0000 mg | ORAL_TABLET | Freq: Every day | ORAL | 3 refills | Status: DC

## 2017-09-29 MED ORDER — HYDROCHLOROTHIAZIDE 50 MG PO TABS: 50.0000 mg | ORAL_TABLET | Freq: Every day | ORAL | 3 refills | Status: DC

## 2017-12-01 ENCOUNTER — Encounter: Payer: Self-pay | Admitting: Nurse Practitioner

## 2017-12-01 ENCOUNTER — Ambulatory Visit: Payer: BLUE CROSS/BLUE SHIELD | Admitting: Nurse Practitioner

## 2017-12-01 ENCOUNTER — Other Ambulatory Visit: Payer: Self-pay

## 2017-12-01 VITALS — BP 124/79 | HR 74 | Temp 98.5°F | Ht 64.5 in | Wt 185.4 lb

## 2017-12-01 DIAGNOSIS — J302 Other seasonal allergic rhinitis: Secondary | ICD-10-CM

## 2017-12-01 DIAGNOSIS — I10 Essential (primary) hypertension: Secondary | ICD-10-CM | POA: Diagnosis not present

## 2017-12-01 NOTE — Progress Notes (Signed)
Subjective:    Patient ID: Linda Guzman, female    DOB: July 28, 1956, 62 y.o.   MRN: [id number]  Linda Guzman is a 62 y.o. female presenting on 12/01/2017 for Hypertension and Cough (persistent cough that causes pain in her left side x 1.5 week. The pt was seen at the urgent care they told her to take mucinex. symptoms improved but not resolved.)   HPI Hypertension - She is not checking BP at home or outside of clinic.    - Current medications: Hydrochlorothiazide 50 mg daily and amlodipine 10 mg once daily, tolerating well without side effects - She is not currently symptomatic. - Pt denies headache, lightheadedness, dizziness, changes in vision, chest tightness/pressure, palpitations, leg swelling, sudden loss of speech or loss of consciousness. 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 - Her diet is moderate in salt, moderate in fat, and moderate in carbohydrates.   Lunch: burgers 3x per week.  Dinner and Breakfast are always at home.  Dinner is large meal and close to bedtime.  Cough Has been to urgent care and did Xray and ruled out pneumonia.  Is taking mucinex-DM.  Symptoms are improved, but not resolved.  Daily improvement.  She does report increased coughing when lying flat, scratchy throat, and postnasal drip.  This is her first spring in the [place] and in [place].  Patient has not previously had environmental allergies, but is in a new environment. -Complicating her course of improvement is left sided flank pain that began several days after coughing.  Patient was coughing very hard when the pain started.  Social History   Tobacco Use  . Smoking status: Never Smoker  . Smokeless tobacco: Never Used  Substance Use Topics  . Alcohol use: No  . Drug use: No    Review of Systems Per HPI unless specifically indicated above     Objective:    BP 124/79 (BP Location: Left Arm, Patient Position: Sitting, Cuff  Size: Normal)   Pulse 74   Temp 98.5 F (36.9 C) (Oral)   Ht 5' 4.5" (1.638 m)   Wt 185 lb 6.4 oz (84.1 kg)   BMI 31.33 kg/m   Wt Readings from Last 3 Encounters:  12/01/17 185 lb 6.4 oz (84.1 kg)  06/03/17 178 lb 6.4 oz (80.9 kg)  03/03/17 174 lb 12.8 oz (79.3 kg)    Physical Exam  Constitutional: She is oriented to person, place, and time. She appears well-developed and well-nourished. No distress.  Neck: Normal range of motion. Neck supple. Carotid bruit is not present.  Cardiovascular: Normal rate, regular rhythm, S1 normal, S2 normal, normal heart sounds and intact distal pulses.  Pulmonary/Chest: Effort normal and breath sounds normal. No respiratory distress.  Abdominal: Soft. Bowel sounds are normal. She exhibits no distension. There is no hepatosplenomegaly. There is tenderness (left flank pain immediately above iliac crest.  No abdominal tenderness). No hernia.  Musculoskeletal: She exhibits no edema (pedal).  Neurological: She is alert and oriented to person, place, and time.  Skin: Skin is warm and dry.  Psychiatric: She has a normal mood and affect. Her behavior is normal.  Vitals reviewed.    Results for orders placed or performed in visit on 01/08/17  Comprehensive metabolic panel  Result Value Ref Range   Sodium 140 135 - 146 mmol/L   Potassium 3.6 3.5 - 5.3 mmol/L   Chloride 102 98 - 110 mmol/L   CO2 27  20 - 31 mmol/L   Glucose, Bld 92 65 - 99 mg/dL   BUN 9 7 - 25 mg/dL   Creat 0.90 0.50 - 0.99 mg/dL   Total Bilirubin 0.6 0.2 - 1.2 mg/dL   Alkaline Phosphatase 84 33 - 130 U/L   AST 21 10 - 35 U/L   ALT 19 6 - 29 U/L   Total Protein 7.5 6.1 - 8.1 g/dL   Albumin 4.1 3.6 - 5.1 g/dL   Calcium 9.6 8.6 - 10.4 mg/dL  Lipid panel  Result Value Ref Range   Cholesterol 201 (H) <200 mg/dL   Triglycerides 99 <150 mg/dL   HDL 64 >50 mg/dL   Total CHOL/HDL Ratio 3.1 <5.0 Ratio   VLDL 20 <30 mg/dL   LDL Cholesterol 117 (H) <100 mg/dL  POCT glycosylated hemoglobin  (Hb A1C)  Result Value Ref Range   Hemoglobin A1C 5.9       Assessment & Plan:   Problem List Items Addressed This Visit      Cardiovascular and Mediastinum   Essential hypertension - Primary    Stable and at goal today.  Pt is taking medications as prescribed w/o side effects. No known complications.  Plan: 1. CONTINUE hydrochlorothiazide 50 mg once daily. - CONTINUE amlodipine 10 mg once daily.  2. Continue DASH eating plan and regular physical activity. 3. Recheck BMP today 4. Follow up 6 months and in 3 months for annual physical.      Relevant Orders   Basic Metabolic Panel (BMET)    Other Visit Diagnoses    Seasonal allergies        Consistent with chronic allergic rhinitis, seasonal, that is exacerbated with lying flat. - Today no evidence of acute sinusitis or complication.  - Has not recently used loratadine, cetirizine, flonase  Plan: 1. START antihistamine cetirizine 10 mg once daily. 2. Follow-up in future, as needed.   Follow up plan: Return in about 6 weeks (around 01/12/2018) for hypertension AND in 3 months for annual physical.  Cassell Smiles, DNP, AGPCNP-BC Adult Gerontology Primary Care Nurse Practitioner [facility] 12/01/2017, 5:20 PM

## 2017-12-01 NOTE — Assessment & Plan Note (Signed)
Stable and at goal today.  Pt is taking medications as prescribed w/o side effects. No known complications.  Plan: 1. CONTINUE hydrochlorothiazide 50 mg once daily. - CONTINUE amlodipine 10 mg once daily.  2. Continue DASH eating plan and regular physical activity. 3. Recheck BMP today 4. Follow up 6 months and in 3 months for annual physical.

## 2017-12-01 NOTE — Patient Instructions (Addendum)
Linda Guzman,   Thank you for coming in to clinic today.  1. Your cough could be worsened by allergies.  START taking loratadine (Claritin) or cetirizine (Zyrtec) 10 mg once daily for the next 4-6 weeks to prevent mucous production.  2. Blood pressure is doing very well.  Continue all medicines without changes.  Please schedule a follow-up appointment with Cassell Smiles, AGNP. Return in about 6 weeks (around 01/12/2018) for hypertension AND in 3 months for annual physical.  If you have any other questions or concerns, please feel free to call the clinic or send a message through [place]. You may also schedule an earlier appointment if necessary.  You will receive a survey after today's visit either digitally by e-mail or paper by [employer]. Your experiences and feedback matter to [place].  Please respond so we know how we are doing as we provide care for you.   Cassell Smiles, DNP, AGNP-BC Adult Gerontology Nurse Practitioner [place]

## 2017-12-02 ENCOUNTER — Telehealth: Payer: Self-pay

## 2017-12-02 LAB — BASIC METABOLIC PANEL
BUN: 10 mg/dL (ref 7–25)
CO2: 30 mmol/L (ref 20–32)
Calcium: 9.7 mg/dL (ref 8.6–10.4)
Chloride: 102 mmol/L (ref 98–110)
Creat: 0.94 mg/dL (ref 0.50–0.99)
Glucose, Bld: 102 mg/dL — ABNORMAL HIGH (ref 65–99)
Potassium: 3.1 mmol/L — ABNORMAL LOW (ref 3.5–5.3)
Sodium: 142 mmol/L (ref 135–146)

## 2017-12-04 NOTE — Telephone Encounter (Signed)
Open in error. Pt husband notified about the pt labs.

## 2018-01-13 ENCOUNTER — Telehealth: Payer: Self-pay | Admitting: Nurse Practitioner

## 2018-01-13 NOTE — Telephone Encounter (Signed)
Pt

## 2018-01-14 ENCOUNTER — Ambulatory Visit: Payer: BLUE CROSS/BLUE SHIELD | Admitting: Nurse Practitioner

## 2018-01-14 ENCOUNTER — Other Ambulatory Visit: Payer: Self-pay

## 2018-01-14 VITALS — BP 119/74 | HR 65 | Temp 98.3°F | Ht 64.5 in | Wt 183.2 lb

## 2018-01-14 DIAGNOSIS — K5909 Other constipation: Secondary | ICD-10-CM

## 2018-01-14 DIAGNOSIS — F5101 Primary insomnia: Secondary | ICD-10-CM | POA: Diagnosis not present

## 2018-01-14 DIAGNOSIS — M65352 Trigger finger, left little finger: Secondary | ICD-10-CM | POA: Diagnosis not present

## 2018-01-14 DIAGNOSIS — I10 Essential (primary) hypertension: Secondary | ICD-10-CM

## 2018-01-14 DIAGNOSIS — R630 Anorexia: Secondary | ICD-10-CM | POA: Diagnosis not present

## 2018-01-14 MED ORDER — PREDNISONE 10 MG PO TABS: ORAL_TABLET | ORAL | 0 refills | Status: DC

## 2018-01-14 NOTE — Patient Instructions (Signed)
Linda Guzman,   Thank you for coming in to clinic today.  1. START miralax and colace once daily for next 2 days.  Then use as needed to have bowel movement once every 1-3 days.    2. START melatonin 3-6 mg nightly about 30 minutes before bedtime.  Take nightly for 2 weeks.  Then, repeat if needed in future.  3. 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  Do not take more than 3,000 mg in 24 hours from all medicines.   - Start prednisone taper over 7 days Day 1-2: 60 mg, Day 3: 50 mg, Day 4: 40 mg; Day 5: 30 mg; Day 6 20 mg; Day 7: 10 mg then stop. - AFTER prednisone if needed, you may take Ibuprofen (200 mg three times daily) or Aleeve (naproxen) 220 mg twice daily as well if tolerated for another 14 days total. May alternate tylenol and ibuprofen in same day.   Sleep Hygiene Tips  Take medicines only as directed by your health care provider.  Keep regular sleeping and waking hours. Avoid naps.  Keep a sleep diary to help you and your health care provider figure out what could be causing your insomnia. Include:  When you sleep.  When you wake up during the night.  How well you sleep.  How rested you feel the next day.  Any side effects of medicines you are taking.  What you eat and drink.  Make your bedroom a comfortable place where it is easy to fall asleep:  Put up shades or special blackout curtains to block light from outside.  Use a white noise machine to block noise.  Keep the temperature cool.  Exercise regularly as directed by your health care provider. Avoid exercising right before bedtime.  Use relaxation techniques to manage stress. Ask your health care provider to suggest some techniques that may work well for you. These may include:  Breathing exercises.  Routines to release muscle tension.  Visualizing peaceful scenes.  Cut back  on alcohol, caffeinated beverages, and cigarettes, especially close to bedtime. These can disrupt your sleep.  Do not overeat or eat spicy foods right before bedtime. This can lead to digestive discomfort that can make it hard for you to sleep.  Limit screen use before bedtime. This includes:  Watching TV.  Using your smartphone, tablet, and computer.  Stick to a routine. This can help you fall asleep faster. Try to do a quiet activity, brush your teeth, and go to bed at the same time each night.  Get out of bed if you are still awake after 15 minutes of trying to sleep. Keep the lights down, but try reading or doing a quiet activity. When you feel sleepy, go back to bed.  Make sure that you drive carefully. Avoid driving if you feel very sleepy.  Keep all follow-up appointments as directed by your health care provider. This is important.    Please schedule a follow-up appointment with Cassell Smiles, AGNP. Return in about 3 months (around 04/16/2018) for hypertension.  If you have any other questions or concerns, please feel free to call the clinic or send a message through [place]. You may also schedule an earlier appointment if necessary.  You will receive a survey after today's visit either digitally by e-mail or paper by [employer]. Your experiences and feedback matter to  [place].  Please respond so we know how we are doing as we provide care for you.   Cassell Smiles, DNP, AGNP-BC Adult Gerontology Nurse Practitioner [place]

## 2018-01-14 NOTE — Progress Notes (Signed)
Subjective:    Patient ID: Linda Guzman, female    DOB: [birth year], 62 y.o.   MRN: [id number]  Linda Guzman is a 62 y.o. female presenting on 01/14/2018 for Insomnia (lack o appetite,nausea and vomiting x 1 week ago.  unable to sleep x 1week  and bilateral hand pain in the palm of the hand and weakness that get worse as the day goes on x 18mths  )   HPI Insomnia Has difficulty falling asleep.  Once falls asleep, she wakes within about 20-30 minutes. Then has difficulty returning to sleep Has tried melatonin x 1 night and it has helped.  Has lasted about 7 days and was sleeping only 2-4 hours per night.  Appetite Decreased appetite.  Occasional nausea and vomiting.  Started about 4-7 days ago.  Has continued since first day.  No diarrhea.  Has had constipation for 2-3 days.  LBM: 3 days ago. No increased pressure.    Left hand pain Started having hand pain about 7 months ago.  Recently has been unable to grip tightly with left hand.  Also reports that her fourth digit occasionally gets stuck in flexed position and is painful to extend.  Hypertension followup scheduled 5/31  -Pt is stable without concerns about hypertension.  Medications are being taken and tolerated well. - Pt denies headache, lightheadedness, dizziness, changes in vision, chest tightness/pressure, palpitations, leg swelling, sudden loss of speech or loss of consciousness.   Social History   Tobacco Use  . Smoking status: Never Smoker  . Smokeless tobacco: Never Used  Substance Use Topics  . Alcohol use: No  . Drug use: No    Review of Systems Per HPI unless specifically indicated above     Objective:    BP 119/74 (BP Location: Right Arm, Patient Position: Sitting, Cuff Size: Normal)   Pulse 65   Temp 98.3 F (36.8 C) (Oral)   Ht 5' 4.5" (1.638 m)   Wt 183 lb 3.2 oz (83.1 kg)   BMI 30.96 kg/m   Wt Readings from Last 3 Encounters:  01/14/18 183 lb 3.2 oz (83.1 kg)  12/01/17 185 lb 6.4 oz (84.1 kg)    06/03/17 178 lb 6.4 oz (80.9 kg)    Physical Exam  Constitutional: She is oriented to person, place, and time. She appears well-developed and well-nourished. No distress.  HENT:  Head: Normocephalic and atraumatic.  Cardiovascular: Normal rate, regular rhythm, S1 normal, S2 normal, normal heart sounds and intact distal pulses.  Pulmonary/Chest: Effort normal and breath sounds normal. No respiratory distress.  Abdominal: Soft. Bowel sounds are normal. She exhibits no distension. There is no hepatosplenomegaly. There is no tenderness. No hernia (umbilical - non incarcerated, non tender, reducible).  Musculoskeletal:  Left hand with reduced grip.  Nontender joints at all PIP, DIP joints.  Fourth MCP joint tender to palpation with nodule palpated on flexor tendon.  R hand normal.  Neurological: She is alert and oriented to person, place, and time.  Skin: Skin is warm and dry.  Psychiatric: She has a normal mood and affect. Her behavior is normal.  Vitals reviewed.    Results for orders placed or performed in visit on 11/91/47  Basic Metabolic Panel (BMET)  Result Value Ref Range   Glucose, Bld 102 (H) 65 - 99 mg/dL   BUN 10 7 - 25 mg/dL   Creat 0.94 0.50 - 0.99 mg/dL   BUN/Creatinine Ratio NOT APPLICABLE 6 - 22 (calc)   Sodium 142 135 - 146 mmol/L  Potassium 3.1 (L) 3.5 - 5.3 mmol/L   Chloride 102 98 - 110 mmol/L   CO2 30 20 - 32 mmol/L   Calcium 9.7 8.6 - 10.4 mg/dL      Assessment & Plan:   Problem List Items Addressed This Visit      Cardiovascular and Mediastinum   Essential hypertension    Stable and at goal today.  Pt is taking medications as prescribed w/o side effects. No known complications.  Plan: 1. CONTINUE hydrochlorothiazide 50 mg once daily. - CONTINUE amlodipine 10 mg once daily.  2. Continue DASH eating plan and regular physical activity. 3. Recheck BMP today 4. Follow up 3 months.       Other Visit Diagnoses    Trigger little finger of left hand     -  Primary   Relevant Medications   predniSONE (DELTASONE) 10 MG tablet   Primary insomnia       Other constipation       Decreased appetite         #1 Trigger finger 4th digit left hand: Acute pain.  No use of adequate NSAID therapy to date.  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 - Start taking Tylenol extra strength 1 to 2 tablets every 6-8 hours for aches or fever/chills for next few days as needed.  Do not take more than 3,000 mg in 24 hours from all medicines.   - Start prednisone taper over 7 days Day 1-2: 60 mg, Day 3: 50 mg, Day 4: 40 mg; Day 5: 30 mg; Day 6 20 mg; Day 7: 10 mg then stop. - AFTER prednisone if needed, may take Ibuprofen (200 mg three times daily) or Aleeve (naproxen) 220 mg twice daily as well if tolerated for another 14 days total. May alternate tylenol and ibuprofen in same day.   #2 Insomnia: Likely is adjustment vs primary insomnia.  Has had disruption of circadian rhythm.   - START melatonin 3-6 mg nightly about 30 minutes before bedtime.  Take nightly for 2 weeks.  Then, repeat if needed in future. - Followup as needed.  #3 Constipation and decreased appetite: Decreased appetite likely related to constipation.  Possible constipation r/t decreased water intake/dehydration and inadequate fiber intake.  - Increase water intake - Increase dietary fiber intake - START miralax and colace once daily for next 2 days.  Then, use only as needed for BM every 1-3 days. - Reviewed warning s/sx.  Followup as needed.  Meds ordered this encounter  Medications  . predniSONE (DELTASONE) 10 MG tablet    Sig: Day 1-2 take 6 pills. Day 3 take 5 pills then reduce by 1 pill each day.    Dispense:  27 tablet    Refill:  0    Order Specific Question:   Supervising Provider    Answer:   Olin Hauser [[id number]]    Follow up plan: Return in about 3 months (around 04/16/2018) for hypertension.  Cassell Smiles, DNP, AGPCNP-BC Adult Gerontology  Primary Care Nurse Practitioner [facility] 01/14/2018, 8:56 AM

## 2018-01-16 ENCOUNTER — Encounter: Payer: Self-pay | Admitting: Nurse Practitioner

## 2018-01-16 ENCOUNTER — Ambulatory Visit: Payer: BLUE CROSS/BLUE SHIELD | Admitting: Nurse Practitioner

## 2018-01-16 NOTE — Assessment & Plan Note (Addendum)
Stable and at goal today.  Pt is taking medications as prescribed w/o side effects. No known complications.  Plan: 1. CONTINUE hydrochlorothiazide 50 mg once daily. - CONTINUE amlodipine 10 mg once daily.  2. Continue DASH eating plan and regular physical activity. 3. Recheck BMP today 4. Follow up 3 months.

## 2018-01-30 ENCOUNTER — Ambulatory Visit
Admission: RE | Admit: 2018-01-30 | Discharge: 2018-01-30 | Disposition: A | Discharge status: Home / Self Care | Payer: BLUE CROSS/BLUE SHIELD | Source: Ambulatory Visit | Attending: Nurse Practitioner | Admitting: Nurse Practitioner

## 2018-01-30 ENCOUNTER — Telehealth: Payer: Self-pay | Admitting: Nurse Practitioner

## 2018-01-30 ENCOUNTER — Ambulatory Visit (INDEPENDENT_AMBULATORY_CARE_PROVIDER_SITE_OTHER): Payer: BLUE CROSS/BLUE SHIELD | Admitting: Nurse Practitioner

## 2018-01-30 ENCOUNTER — Encounter: Payer: Self-pay | Admitting: Nurse Practitioner

## 2018-01-30 ENCOUNTER — Other Ambulatory Visit: Payer: Self-pay

## 2018-01-30 VITALS — BP 114/70 | HR 77 | Temp 98.2°F | Ht 64.5 in | Wt 180.8 lb

## 2018-01-30 DIAGNOSIS — M4856XA Collapsed vertebra, not elsewhere classified, lumbar region, initial encounter for fracture: Secondary | ICD-10-CM | POA: Diagnosis not present

## 2018-01-30 DIAGNOSIS — G8929 Other chronic pain: Secondary | ICD-10-CM | POA: Insufficient documentation

## 2018-01-30 DIAGNOSIS — M5441 Lumbago with sciatica, right side: Secondary | ICD-10-CM | POA: Insufficient documentation

## 2018-01-30 DIAGNOSIS — M48061 Spinal stenosis, lumbar region without neurogenic claudication: Secondary | ICD-10-CM | POA: Diagnosis not present

## 2018-01-30 MED ORDER — BACLOFEN 10 MG PO TABS: 10.0000 mg | ORAL_TABLET | Freq: Three times a day (TID) | ORAL | 0 refills | Status: DC

## 2018-01-30 MED ORDER — GABAPENTIN 100 MG PO CAPS: 100.0000 mg | ORAL_CAPSULE | Freq: Two times a day (BID) | ORAL | 1 refills | Status: DC | PRN

## 2018-01-30 NOTE — Patient Instructions (Addendum)
Linda Guzman,   Thank you for coming in to clinic today.  1. You have a lumbar (low back) muscle strain. Likely caused by stooping, pushing and pulling - Start taking Tylenol extra strength 1 to 2 tablets every 6-8 hours for aches or fever/chills for next few days as needed.  Do not take more than 3,000 mg in 24 hours from all medicines.  May take Ibuprofen as well if tolerated 200-400mg  every 8 hours as needed. May alternate tylenol and ibuprofen in same day.  Use ibuprofen for only 2 weeks, then use only Tylenol because of blood pressure - Use heat and ice.  Apply this for 15 minutes at a time 6-8 times per day.   - Muscle rub with lidocaine (Aspercreme or generic), lidocaine patch, Biofreeze, or tiger balm for topical pain relief.  Avoid using this with heat and ice to avoid burns. - START muscle relaxer baclofen 10 mg one tablet up to three times daily.  This can cause drowsiness, so use caution.  It may be best to only take this at night for helping you during sleep.  - If baclofen is not helping with your shooting pain, START gabapentin 300 mg one tablet at bedtime as needed for nerve pain.   Please schedule a follow-up appointment with Cassell Smiles, AGNP. Return 4-6 weeks if symptoms worsen or fail to improve.  If you have any other questions or concerns, please feel free to call the clinic or send a message through [place]. You may also schedule an earlier appointment if necessary.  You will receive a survey after today's visit either digitally by e-mail or paper by [employer]. Your experiences and feedback matter to [place].  Please respond so we know how we are doing as we provide care for you.   Cassell Smiles, DNP, AGNP-BC Adult Gerontology Nurse Practitioner [place]

## 2018-01-30 NOTE — Telephone Encounter (Signed)
Pt didn't understand xray results.  Please call husband [phone]

## 2018-01-30 NOTE — Progress Notes (Signed)
Subjective:    Patient ID: Linda Guzman, female    DOB: Jul 12, 1956, 62 y.o.   MRN: [id number]  Linda Guzman is a 62 y.o. female presenting on 01/30/2018 for Back Pain (sciatic back pain, pain in the lower back on both sides that radiates down the buttuck and leg. Pain is worse on the Right side. Pt state the pain been since the fall, but it started improving and x 3 days ago the pain got worse. Pt thinks it could be related to a fall x 5 mths ago.)   HPI Back Pain: Patient presents for presents evaluation of low back problems.  Symptoms have been present for 6 months and include pain in lumbar spine (aching in character; 5/10 in severity). Initial inciting event: fall approximately 5-6 months ago. Symptoms have worsened over last 3 days with new radicular pain and increased right sided lumbar pain into buttocks. Alleviating factors identifiable by patient are nothing other than rest. Exacerbating factors identifiable by patient are bending backwards, bending forwards, bending sideways, increased intrathoracic pressure (cough, sneeze, etc.), sitting, standing and walking. Pt is helping with flooring business and may have exacerbated chronic pain with subacute worsening.  No sudden onset of new pain. Treatments so far initiated by patient: NSAID, Tylenol prn but is subtherapeutic. Previous workup: none. Previous treatments: none.   Social History   Tobacco Use  . Smoking status: Never Smoker  . Smokeless tobacco: Never Used  Substance Use Topics  . Alcohol use: No  . Drug use: No    Review of Systems Per HPI unless specifically indicated above     Objective:    BP 114/70 (BP Location: Right Arm, Patient Position: Sitting, Cuff Size: Normal)   Pulse 77   Temp 98.2 F (36.8 C) (Oral)   Ht 5' 4.5" (1.638 m)   Wt 180 lb 12.8 oz (82 kg)   BMI 30.55 kg/m   Wt Readings from Last 3 Encounters:  01/30/18 180 lb 12.8 oz (82 kg)  01/14/18 183 lb 3.2 oz (83.1 kg)  12/01/17 185 lb 6.4 oz (84.1  kg)    Physical Exam  Constitutional: She is oriented to person, place, and time. She appears well-developed and well-nourished. No distress.  Pt very stoic at rest, in visible pain with movement requiring short, frequent breaths  HENT:  Head: Normocephalic and atraumatic.  Cardiovascular: Normal rate, regular rhythm, S1 normal, S2 normal, normal heart sounds and intact distal pulses.  Pulmonary/Chest: Effort normal and breath sounds normal. No respiratory distress.  Musculoskeletal:  Low Back Inspection: Normal appearance, Large body habitus, no spinal deformity, symmetrical. Palpation: No tenderness over spinous processes. Right sided lumbar paraspinal muscles tender and with hypertonicity/spasm. ROM: Significantly limited ROM in all directions (forward flex / back extension, rotation L/R) with discomfort. Pt's movements are very guarded and pain-inducing Special Testing: Seated SLR with reproduced localized R back pain but negative for radicular pain.  Strength: Bilateral hip flex/ext 5/5, knee flex/ext 5/5, ankle dorsiflex/plantarflex 5/5 Neurovascular: intact distal sensation to light touch   Neurological: She is alert and oriented to person, place, and time.  Skin: Skin is warm and dry.  Psychiatric: She has a normal mood and affect. Her behavior is normal.  Vitals reviewed.   Results for orders placed or performed in visit on 50/53/97  Basic Metabolic Panel (BMET)  Result Value Ref Range   Glucose, Bld 102 (H) 65 - 99 mg/dL   BUN 10 7 - 25 mg/dL   Creat 0.94 0.50 -  0.99 mg/dL   BUN/Creatinine Ratio NOT APPLICABLE 6 - 22 (calc)   Sodium 142 135 - 146 mmol/L   Potassium 3.1 (L) 3.5 - 5.3 mmol/L   Chloride 102 98 - 110 mmol/L   CO2 30 20 - 32 mmol/L   Calcium 9.7 8.6 - 10.4 mg/dL      Assessment & Plan:   Problem List Items Addressed This Visit    None    Visit Diagnoses    Chronic right-sided low back pain with right-sided sciatica    -  Primary   Relevant Medications     baclofen (LIORESAL) 10 MG tablet   gabapentin (NEURONTIN) 100 MG capsule   Other Relevant Orders   DG Lumbar Spine Complete    Chronic pain with acute exacerbation that is likely self-limited.  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  Now with R lumbar pain and radiculopathy.  Plan:  1. Treat with OTC pain meds (acetaminophen and ibuprofen).  Discussed alternate dosing and max dosing. 2. Apply heat and/or ice to affected area. 3. May also apply a muscle rub with lidocaine or lidocaine patch after heat or ice. 4. Take muscle relaxer baclofen 10 mg up to three times daily.  Cautioned drowsiness. - May start gabapentin 100-200 mg at bedtime prn radicular pain.  Use only if not improved with baclofen. 5. Lumbar spine Xray today.  Pt may require MRI in future if pain not improved or resolved. 6. Consider PT to prevent re-injury 7. Follow up 4-6 weeks prn    Meds ordered this encounter  Medications  . baclofen (LIORESAL) 10 MG tablet    Sig: Take 1 tablet (10 mg total) by mouth 3 (three) times daily.    Dispense:  30 each    Refill:  0    Order Specific Question:   Supervising Provider    Answer:   Olin Hauser [[id number]]  . gabapentin (NEURONTIN) 100 MG capsule    Sig: Take 1 capsule (100 mg total) by mouth 3 times/day as needed-between meals & bedtime (nerve pain, sciatica).    Dispense:  60 capsule    Refill:  1    Order Specific Question:   Supervising Provider    Answer:   Olin Hauser [[id number]]    Follow up plan: Return 4-6 weeks if symptoms worsen or fail to improve.  Cassell Smiles, DNP, AGPCNP-BC Adult Gerontology Primary Care Nurse Practitioner [facility] 01/30/2018, 11:45 AM

## 2018-02-02 ENCOUNTER — Telehealth: Payer: Self-pay

## 2018-02-02 DIAGNOSIS — M5441 Lumbago with sciatica, right side: Secondary | ICD-10-CM

## 2018-02-02 NOTE — Telephone Encounter (Addendum)
Covering inbox for Linda Guzman, AGPCNP-BC while she is out of office.  I have reviewed the chart, patient was seen last week on Friday 6/14 for back pain. She has reported that symptoms present for long time >6 months and now worsening flare up with some nerve symptoms.  I agree with the treatment done by Linda Guzman, AGPCNP-BC also with X-ray imaging to start.  However, for a few reasons I would not plan to order Lumbar Spine MRI at this time.  1. I will defer this to Linda Guzman when she returns she may reconsider this imaging test  2. Most importantly - we would likely not be able to get MRI approved by insurance so soon after X-ray. They usually recommend treatment with medications and Physical Therapy for 4-6 weeks before pursuing MRI or other advanced imaging tests. Also - if she is having MRI done then she would likely need to see a Back or Spine Specialist to interpret the MRI.  I can offer a short term relief medication such as Prednisone taper over 6 days - strong anti inflammatory if she is interested in this to ease her symptoms while we wait on future follow-up plan.  Also I would recommend the referral listed above if interested, and then an Orthopedic Specialist or Neurosurgery/Spine Specialist may recommend Physical Therapy as well before the MRI.  Linda Guzman, [facility] 02/02/2018, 1:14 PM

## 2018-02-02 NOTE — Telephone Encounter (Signed)
Patient's spouse called regarding lab result and xray result. He states patient's pain is not improving and wants to proceed for MRI.

## 2018-02-02 NOTE — Telephone Encounter (Signed)
Advised Spouse wants to go with MRI see other message.

## 2018-02-03 MED ORDER — PREDNISONE 10 MG PO TABS: ORAL_TABLET | ORAL | 0 refills | Status: DC

## 2018-02-03 NOTE — Telephone Encounter (Signed)
Reviewed most recent update.  I agree to send Prednisone today, 6 day dose pak taper 60mg  down to 10mg  over 6 days then stop. While taking Prednisone she cannot take Ibuprofen, Advil, Aleve. She may continue other meds Baclofen, Gabapentin, Tylenol as needed.  I would recommend HOLDING on PT order at this time. Given review of patient's last office visit with PCP on 01/30/18 and description I am told patient was in significant pain, and she would likely not do well to initiate PT at this time.  Will start with treatment of prednisone first, if her pain improves over 1 week then likely will need to provide [place] an update in 1 week or return to office for re-eval and then we can determine if PT is right option for her to proceed. OR we may end up recommending Orthopedic first and they can make that determination of PT or not.  Sent this message to Linda Guzman CMA to contact patient and CC to PCP Linda Guzman, AGPCNP-BC  Linda Guzman, [facility] 02/03/2018, 1:27 PM

## 2018-02-03 NOTE — Telephone Encounter (Signed)
I spoke with the pt husband and he would like for you to send the Prednisone over and place the referral for PT. He states she's in a lot of pain and whatever we can do to ease her pain he would like to proceed with the.

## 2018-02-03 NOTE — Addendum Note (Signed)
Addended by: Olin Hauser on: 02/03/2018 01:27 PM   Modules accepted: Orders

## 2018-02-04 NOTE — Telephone Encounter (Signed)
Attempted to contact the pt, no answer. VM full. I will try again.

## 2018-02-04 NOTE — Telephone Encounter (Signed)
The pt returned my call and was notified of Dr. Raliegh Ip recommendation.

## 2018-02-16 ENCOUNTER — Telehealth: Payer: Self-pay

## 2018-02-16 NOTE — Telephone Encounter (Signed)
These pain medications are often continued for extended periods of time and are safe to use until possible MRI.   - NEXT step: physical therapy  - usually required prior to MRI approval.   - AFTER physical therapy, MRI would be indicated.   Referral placed for physical therapy at [facility].  [facility] will be faster and may be able to start today.  Please let me know if patient would prefer paper order faxed for [facility]

## 2018-02-16 NOTE — Telephone Encounter (Signed)
The pt husband (Oti) called stating that Linda Guzman pain have improved, but not completely resolved. He wants to know what the next step, because he's concern about her having to continue on the pain medications for so long. Please advise His cellphone # [phone]

## 2018-02-16 NOTE — Telephone Encounter (Signed)
Attempted to contact the pt husband Oti. No answer. LMOM to return my call.

## 2018-02-23 NOTE — Telephone Encounter (Signed)
The pt husband came in the office today. Referral faxed over to [facility].

## 2018-02-23 NOTE — Telephone Encounter (Signed)
The patient husband came by the office to f/u on the next step for his wife back pain. I informed him that I called him last week, but Lauren recommendation is that she start physical therapy. He prefer [facility] because they can get her in sooner.  Form placed on Lauren desk to fill out.

## 2018-03-10 ENCOUNTER — Encounter: Payer: BLUE CROSS/BLUE SHIELD | Admitting: Nurse Practitioner

## 2018-03-19 ENCOUNTER — Encounter: Payer: BLUE CROSS/BLUE SHIELD | Admitting: Nurse Practitioner

## 2018-04-09 ENCOUNTER — Other Ambulatory Visit (HOSPITAL_COMMUNITY)
Admission: RE | Admit: 2018-04-09 | Discharge: 2018-04-09 | Disposition: A | Discharge status: Home / Self Care | Payer: BLUE CROSS/BLUE SHIELD | Source: Ambulatory Visit | Attending: Nurse Practitioner | Admitting: Nurse Practitioner

## 2018-04-09 ENCOUNTER — Encounter: Payer: Self-pay | Admitting: Nurse Practitioner

## 2018-04-09 ENCOUNTER — Ambulatory Visit (INDEPENDENT_AMBULATORY_CARE_PROVIDER_SITE_OTHER): Payer: BLUE CROSS/BLUE SHIELD | Admitting: Nurse Practitioner

## 2018-04-09 ENCOUNTER — Other Ambulatory Visit: Payer: Self-pay

## 2018-04-09 VITALS — BP 119/77 | HR 73 | Temp 98.6°F | Ht 64.5 in | Wt 179.8 lb

## 2018-04-09 DIAGNOSIS — Z1231 Encounter for screening mammogram for malignant neoplasm of breast: Secondary | ICD-10-CM

## 2018-04-09 DIAGNOSIS — Z124 Encounter for screening for malignant neoplasm of cervix: Secondary | ICD-10-CM

## 2018-04-09 DIAGNOSIS — Z13820 Encounter for screening for osteoporosis: Secondary | ICD-10-CM

## 2018-04-09 DIAGNOSIS — Z0000 Encounter for general adult medical examination without abnormal findings: Secondary | ICD-10-CM | POA: Diagnosis not present

## 2018-04-09 DIAGNOSIS — Z23 Encounter for immunization: Secondary | ICD-10-CM | POA: Diagnosis not present

## 2018-04-09 DIAGNOSIS — Z1211 Encounter for screening for malignant neoplasm of colon: Secondary | ICD-10-CM | POA: Diagnosis not present

## 2018-04-09 DIAGNOSIS — Z1239 Encounter for other screening for malignant neoplasm of breast: Secondary | ICD-10-CM

## 2018-04-09 NOTE — Progress Notes (Signed)
Subjective:    Patient ID: Linda Guzman, female    DOB: 18-Oct-1955, 62 y.o.   MRN: [id number]  Linda Guzman is a 62 y.o. female presenting on 04/09/2018 for Annual Exam  HPI Annual Physical Exam Patient has been feeling well.  Back pain is improving significantly.  They have no acute concerns today. Sleeps 6-7 hours, occasionally 5 hours per night uninterrupted usually, but occasionally urinates once per night and is easy to return to sleep.  HEALTH MAINTENANCE: Weight/BMI: slowly improving Physical activity: always active with work Diet: eating fewer late night meals, less fast food Seatbelt: always Sunscreen: rarely to never PAP: due Mammogram: due DEXA: due Colon Cancer Screen: due - patient prefers colonoscopy HIV/HEP C: screen today Optometry: done Dentistry: No cleanings  VACCINES: Tetanus: due Shingles: no chicken pox as child  Past Medical History:  Diagnosis Date  . Hypertension    No past surgical history on file. Social History   Socioeconomic History  . Marital status: Married    Spouse name: Not on file  . Number of children: Not on file  . Years of education: Not on file  . Highest education level: Not on file  Occupational History  . Not on file  Social Needs  . Financial resource strain: Not on file  . Food insecurity:    Worry: Not on file    Inability: Not on file  . Transportation needs:    Medical: Not on file    Non-medical: Not on file  Tobacco Use  . Smoking status: Never Smoker  . Smokeless tobacco: Never Used  Substance and Sexual Activity  . Alcohol use: No  . Drug use: No  . Sexual activity: Yes    Birth control/protection: Post-menopausal  Lifestyle  . Physical activity:    Days per week: Not on file    Minutes per session: Not on file  . Stress: Not on file  Relationships  . Social connections:    Talks on phone: Not on file    Gets together: Not on file    Attends religious service: Not on file    Active member of club  or organization: Not on file    Attends meetings of clubs or organizations: Not on file    Relationship status: Not on file  . Intimate partner violence:    Fear of current or ex partner: Not on file    Emotionally abused: Not on file    Physically abused: Not on file    Forced sexual activity: Not on file  Other Topics Concern  . Not on file  Social History Narrative  . Not on file   Family History  Problem Relation Age of Onset  . Hypertension Mother   . Stroke Sister    Current Outpatient Medications on File Prior to Visit  Medication Sig  . amLODipine (NORVASC) 10 MG tablet Take 1 tablet (10 mg total) by mouth daily.  . cholecalciferol (VITAMIN D) 1000 units tablet Take 1,000 Units by mouth daily.  . hydrochlorothiazide (HYDRODIURIL) 50 MG tablet Take 1 tablet (50 mg total) by mouth daily.  . magnesium oxide (MAG-OX) 400 MG tablet Take 400 mg by mouth daily.   No current facility-administered medications on file prior to visit.     Review of Systems  Constitutional: Negative for chills and fever.  HENT: Negative for congestion and sore throat.   Eyes: Negative for pain.  Respiratory: Negative for cough, shortness of breath and wheezing.   Cardiovascular: Negative  for chest pain, palpitations and leg swelling.  Gastrointestinal: Negative for abdominal pain, blood in stool, constipation, diarrhea, nausea and vomiting.  Endocrine: Negative for polydipsia.  Genitourinary: Negative for dysuria, frequency, hematuria and urgency.  Musculoskeletal: Positive for back pain (mild, intermittent, improving.  Only has buttock pain when standing for long periods of time). Negative for myalgias and neck pain.  Skin: Negative.  Negative for rash.  Allergic/Immunologic: Negative for environmental allergies.  Neurological: Negative for dizziness, weakness and headaches.  Hematological: Does not bruise/bleed easily.  Psychiatric/Behavioral: Negative for dysphoric mood and suicidal ideas.  The patient is not nervous/anxious.    Per HPI unless specifically indicated above     Objective:    BP 119/77 (BP Location: Right Arm, Patient Position: Sitting, Cuff Size: Normal)   Pulse 73   Temp 98.6 F (37 C) (Oral)   Ht 5' 4.5" (1.638 m)   Wt 179 lb 12.8 oz (81.6 kg)   BMI 30.39 kg/m   Wt Readings from Last 3 Encounters:  04/09/18 179 lb 12.8 oz (81.6 kg)  01/30/18 180 lb 12.8 oz (82 kg)  01/14/18 183 lb 3.2 oz (83.1 kg)    Physical Exam  Constitutional: She is oriented to person, place, and time. She appears well-developed and well-nourished. No distress.  HENT:  Head: Normocephalic and atraumatic.  Right Ear: External ear normal.  Left Ear: External ear normal.  Nose: Nose normal.  Mouth/Throat: Oropharynx is clear and moist.  Eyes: Pupils are equal, round, and reactive to light. Conjunctivae are normal.  Neck: Normal range of motion. Neck supple. No JVD present. No tracheal deviation present. No thyromegaly present.  Cardiovascular: Normal rate, regular rhythm, normal heart sounds and intact distal pulses. Exam reveals no gallop and no friction rub.  No murmur heard. Pulmonary/Chest: Effort normal and breath sounds normal. No respiratory distress.  Breast - Normal exam w/ symmetric breasts, no mass, no nipple discharge, no skin changes or tenderness.   Abdominal: Soft. Bowel sounds are normal. She exhibits no distension. There is no hepatosplenomegaly. There is no tenderness.  Genitourinary:  Genitourinary Comments: Normal external female genitalia without lesions or fusion. Vaginal canal without lesions. Normal appearing cervix without lesions or friability. Physiologic discharge on exam. Bimanual exam without adnexal masses, enlarged uterus, or cervical motion tenderness.  Patient's husband remained in room during exam to help interpret if needed.  Musculoskeletal: Normal range of motion.  Lymphadenopathy:    She has no cervical adenopathy.  Neurological: She is  alert and oriented to person, place, and time. No cranial nerve deficit.  Skin: Skin is warm and dry. Capillary refill takes less than 2 seconds.  Psychiatric: She has a normal mood and affect. Her behavior is normal. Judgment and thought content normal.  Nursing note and vitals reviewed.    Results for orders placed or performed in visit on 41/96/22  Basic Metabolic Panel (BMET)  Result Value Ref Range   Glucose, Bld 102 (H) 65 - 99 mg/dL   BUN 10 7 - 25 mg/dL   Creat 0.94 0.50 - 0.99 mg/dL   BUN/Creatinine Ratio NOT APPLICABLE 6 - 22 (calc)   Sodium 142 135 - 146 mmol/L   Potassium 3.1 (L) 3.5 - 5.3 mmol/L   Chloride 102 98 - 110 mmol/L   CO2 30 20 - 32 mmol/L   Calcium 9.7 8.6 - 10.4 mg/dL      Assessment & Plan:   Problem List Items Addressed This Visit    None  Visit Diagnoses    Encounter for annual physical exam    -  Primary   Relevant Orders   Ambulatory referral to Gastroenterology   MM DIGITAL SCREENING BILATERAL   DG Bone Density   Hepatitis C antibody   HIV antibody   Lipid panel   CBC with Differential/Platelet   COMPLETE METABOLIC PANEL WITH GFR   Hemoglobin A1c   TSH   Need for diphtheria-tetanus-pertussis (Tdap) vaccine       Relevant Orders   Tdap vaccine greater than or equal to 7yo IM   Colon cancer screening       Relevant Orders   Ambulatory referral to Gastroenterology   Breast cancer screening       Relevant Orders   MM DIGITAL SCREENING BILATERAL   Osteoporosis screening       Relevant Orders   DG Bone Density   Cervical cancer screening       Relevant Orders   Cytology - PAP      Physical exam with no new findings.  Well adult with no acute concerns.  Plan: 1. Obtain health maintenance screenings as above according to age. - Increase physical activity to 30 minutes most days of the week.  - Eat healthy diet high in vegetables and fruits; low in refined carbohydrates. - PAP - first cervical cancer screening.  Patient agrees and  has no concerns - DEXA for osteoporosis screening. - Colon cancer screening - patient prefers colonoscopy for screening over cologuard - Tdap due today.  Patient agrees to receive. 2. Return 1 year for annual physical.   Follow up plan: Return in about 1 year (around 04/10/2019) for annual physical AND 6 months for blood pressure.  Cassell Smiles, DNP, AGPCNP-BC Adult Gerontology Primary Care Nurse Practitioner [facility] 04/09/2018, 8:43 AM

## 2018-04-09 NOTE — Patient Instructions (Addendum)
Linda Guzman,   Thank you for coming in to clinic today.  1. Your mammogram and bone density scan orders have been placed.  Call the Scheduling phone number at [phone] to schedule your mammogram at your convenience.  You can choose to go to either location listed below.  Let the scheduler know which location you prefer.  [place]  [place], [place] [address]   [facility] [address] [place], [place] [address]  2. Continue working on healthy eating.  3. Your lab results will be called once they result.  Please schedule a follow-up appointment with Cassell Smiles, AGNP. Return in about 1 year (around 04/10/2019) for annual physical AND 6 months for blood pressure.  If you have any other questions or concerns, please feel free to call the clinic or send a message through [place]. You may also schedule an earlier appointment if necessary.  You will receive a survey after today's visit either digitally by e-mail or paper by [employer]. Your experiences and feedback matter to [place].  Please respond so we know how we are doing as we provide care for you.   Cassell Smiles, DNP, AGNP-BC Adult Gerontology Nurse Practitioner [facility], [facility]   Mammogram A mammogram is an X-ray of the breasts that is done to check for abnormal changes. This procedure can screen for and detect any changes that may suggest breast cancer. A mammogram can also identify other changes and variations in the breast, such as:  Inflammation of the breast tissue (mastitis).  An infected area that contains a collection of pus (abscess).  A fluid-filled sac (cyst).  Fibrocystic changes. This is when breast tissue becomes denser, which can make the tissue feel rope-like or uneven under the skin.  Tumors that are not cancerous (benign).  Tell a health care provider about:  Any  allergies you have.  If you have breast implants.  If you have had previous breast disease, biopsy, or surgery.  If you are breastfeeding.  Any possibility that you could be pregnant, if this applies.  If you are younger than age 60.  If you have a family history of breast cancer. What are the risks? Generally, this is a safe procedure. However, problems may occur, including:  Exposure to radiation. Radiation levels are very low with this test.  The results being misinterpreted.  The need for further tests.  The inability of the mammogram to detect certain cancers.  What happens before the procedure?  Schedule your test about 1-2 weeks after your menstrual period. This is usually when your breasts are the least tender.  If you have had a mammogram done at a different facility in the past, get the mammogram X-rays or have them sent to your current exam facility in order to compare them.  Wash your breasts and under your arms the day of the test.  Do not wear deodorants, perfumes, lotions, or powders anywhere on your body on the day of the test.  Remove any jewelry from your neck.  Wear clothes that you can change into and out of easily. What happens during the procedure?  You will undress from the waist up and put on a gown.  You will stand in front of the X-ray machine.  Each breast will be placed between two plastic or glass plates. The plates will compress your breast for a few seconds. Try to stay as  relaxed as possible during the procedure. This does not cause any harm to your breasts and any discomfort you feel will be very brief.  X-rays will be taken from different angles of each breast. The procedure may vary among health care providers and hospitals. What happens after the procedure?  The mammogram will be examined by a specialist (radiologist).  You may need to repeat certain parts of the test, depending on the quality of the images. This is commonly done  if the radiologist needs a better view of the breast tissue.  Ask when your test results will be ready. Make sure you get your test results.  You may resume your normal activities. This information is not intended to replace advice given to you by your health care provider. Make sure you discuss any questions you have with your health care provider. Document Released: 08/02/2000 Document Revised: 01/08/2016 Document Reviewed: 10/14/2014 Elsevier Interactive Patient Education  2018 [employer]. Bone Densitometry Bone densitometry is an imaging test that uses a special X-ray to measure the amount of calcium and other minerals in your bones (bone density). This test is also known as a bone mineral density test or dual-energy X-ray absorptiometry (DXA). The test can measure bone density at your hip and your spine. It is similar to having a regular X-ray. You may have this test to:  Diagnose a condition that causes weak or thin bones (osteoporosis).  Predict your risk of a broken bone (fracture).  Determine how well osteoporosis treatment is working.  Tell a health care provider about:  Any allergies you have.  All medicines you are taking, including vitamins, herbs, eye drops, creams, and over-the-counter medicines.  Any problems you or family members have had with anesthetic medicines.  Any blood disorders you have.  Any surgeries you have had.  Any medical conditions you have.  Possibility of pregnancy.  Any other medical test you had within the previous 14 days that used contrast material. What are the risks? Generally, this is a safe procedure. However, problems can occur and may include the following:  This test exposes you to a very small amount of radiation.  The risks of radiation exposure may be greater to unborn children.  What happens before the procedure?  Do not take any calcium supplements for 24 hours before having the test. You can otherwise eat and drink  what you usually do.  Take off all metal jewelry, eyeglasses, dental appliances, and any other metal objects. What happens during the procedure?  You may lie on an exam table. There will be an X-ray generator below you and an imaging device above you.  Other devices, such as boxes or braces, may be used to position your body properly for the scan.  You will need to lie still while the machine slowly scans your body.  The images will show up on a computer monitor. What happens after the procedure? You may need more testing at a later time. This information is not intended to replace advice given to you by your health care provider. Make sure you discuss any questions you have with your health care provider. Document Released: 08/27/2004 Document Revised: 01/11/2016 Document Reviewed: 01/13/2014 Elsevier Interactive Patient Education  2018 [employer].

## 2018-04-10 ENCOUNTER — Other Ambulatory Visit: Payer: Self-pay

## 2018-04-10 DIAGNOSIS — Z1211 Encounter for screening for malignant neoplasm of colon: Secondary | ICD-10-CM

## 2018-04-10 LAB — COMPLETE METABOLIC PANEL WITH GFR
AG Ratio: 1.4 (calc) (ref 1.0–2.5)
ALT: 24 U/L (ref 6–29)
AST: 25 U/L (ref 10–35)
Albumin: 4.4 g/dL (ref 3.6–5.1)
Alkaline phosphatase (APISO): 55 U/L (ref 33–130)
BUN/Creatinine Ratio: 16 (calc) (ref 6–22)
BUN: 16 mg/dL (ref 7–25)
CO2: 27 mmol/L (ref 20–32)
Calcium: 9.8 mg/dL (ref 8.6–10.4)
Chloride: 102 mmol/L (ref 98–110)
Creat: 1.02 mg/dL — ABNORMAL HIGH (ref 0.50–0.99)
GFR, Est African American: 69 mL/min/{1.73_m2} (ref >=60)
GFR, Est Non African American: 59 mL/min/{1.73_m2} — ABNORMAL LOW (ref >=60)
Globulin: 3.1 g/dL (calc) (ref 1.9–3.7)
Glucose, Bld: 114 mg/dL — ABNORMAL HIGH (ref 65–99)
Potassium: 3.4 mmol/L — ABNORMAL LOW (ref 3.5–5.3)
Sodium: 142 mmol/L (ref 135–146)
Total Bilirubin: 0.6 mg/dL (ref 0.2–1.2)
Total Protein: 7.5 g/dL (ref 6.1–8.1)

## 2018-04-10 LAB — CBC WITH DIFFERENTIAL/PLATELET
Basophils Absolute: 28 cells/uL (ref 0–200)
Basophils Relative: 0.5 %
Eosinophils Absolute: 241 cells/uL (ref 15–500)
Eosinophils Relative: 4.3 %
HCT: 42.3 % (ref 35.0–45.0)
Hemoglobin: 13.7 g/dL (ref 11.7–15.5)
Lymphs Abs: 1876 cells/uL (ref 850–3900)
MCH: 27.5 pg (ref 27.0–33.0)
MCHC: 32.4 g/dL (ref 32.0–36.0)
MCV: 84.9 fL (ref 80.0–100.0)
MPV: 12.8 fL — ABNORMAL HIGH (ref 7.5–12.5)
Monocytes Relative: 7.6 %
Neutro Abs: 3030 cells/uL (ref 1500–7800)
Neutrophils Relative %: 54.1 %
Platelets: 210 10*3/uL (ref 140–400)
RBC: 4.98 10*6/uL (ref 3.80–5.10)
RDW: 13.4 % (ref 11.0–15.0)
Total Lymphocyte: 33.5 %
WBC mixed population: 426 cells/uL (ref 200–950)
WBC: 5.6 10*3/uL (ref 3.8–10.8)

## 2018-04-10 LAB — LIPID PANEL
Cholesterol: 207 mg/dL — ABNORMAL HIGH (ref <200)
HDL: 58 mg/dL (ref >50)
LDL Cholesterol (Calc): 129 mg/dL (calc) — ABNORMAL HIGH
Non-HDL Cholesterol (Calc): 149 mg/dL (calc) — ABNORMAL HIGH (ref <130)
Total CHOL/HDL Ratio: 3.6 (calc) (ref <5.0)
Triglycerides: 92 mg/dL (ref <150)

## 2018-04-10 LAB — HEMOGLOBIN A1C
Hgb A1c MFr Bld: 6 % of total Hgb — ABNORMAL HIGH (ref <5.7)
Mean Plasma Glucose: 126 (calc)
eAG (mmol/L): 7 (calc)

## 2018-04-10 LAB — HEPATITIS C ANTIBODY
Hepatitis C Ab: NONREACTIVE
SIGNAL TO CUT-OFF: 0.02 (ref <1.00)

## 2018-04-10 LAB — HIV ANTIBODY (ROUTINE TESTING W REFLEX): HIV 1&2 Ab, 4th Generation: NONREACTIVE

## 2018-04-10 LAB — TSH: TSH: 0.72 mIU/L (ref 0.40–4.50)

## 2018-04-14 LAB — CYTOLOGY - PAP
Diagnosis: NEGATIVE
HPV: NOT DETECTED

## 2018-04-16 ENCOUNTER — Ambulatory Visit: Payer: BLUE CROSS/BLUE SHIELD | Admitting: Nurse Practitioner

## 2018-04-24 ENCOUNTER — Telehealth: Payer: Self-pay | Admitting: Nurse Practitioner

## 2018-04-24 DIAGNOSIS — I10 Essential (primary) hypertension: Secondary | ICD-10-CM

## 2018-04-24 MED ORDER — HYDROCHLOROTHIAZIDE 50 MG PO TABS: 50.0000 mg | ORAL_TABLET | Freq: Every day | ORAL | 3 refills | Status: DC

## 2018-04-24 MED ORDER — AMLODIPINE BESYLATE 10 MG PO TABS: 10.0000 mg | ORAL_TABLET | Freq: Every day | ORAL | 3 refills | Status: DC

## 2018-04-24 NOTE — Telephone Encounter (Signed)
Pt needs refills on amlodipine and hydrochlorothiazide sent to [facility].

## 2018-05-04 ENCOUNTER — Encounter: Payer: Self-pay | Admitting: *Deleted

## 2018-05-05 ENCOUNTER — Ambulatory Visit
Admission: RE | Admit: 2018-05-05 | Discharge: 2018-05-05 | Disposition: A | Discharge status: Home / Self Care | Payer: BLUE CROSS/BLUE SHIELD | Source: Ambulatory Visit | Attending: Gastroenterology | Admitting: Gastroenterology

## 2018-05-05 ENCOUNTER — Ambulatory Visit: Payer: BLUE CROSS/BLUE SHIELD | Admitting: Anesthesiology

## 2018-05-05 ENCOUNTER — Encounter
Admission: RE | Disposition: A | Discharge status: Home / Self Care | Payer: Self-pay | Source: Ambulatory Visit | Attending: Gastroenterology

## 2018-05-05 ENCOUNTER — Encounter: Payer: Self-pay | Admitting: *Deleted

## 2018-05-05 DIAGNOSIS — Z1211 Encounter for screening for malignant neoplasm of colon: Secondary | ICD-10-CM | POA: Insufficient documentation

## 2018-05-05 DIAGNOSIS — K635 Polyp of colon: Secondary | ICD-10-CM

## 2018-05-05 DIAGNOSIS — D124 Benign neoplasm of descending colon: Secondary | ICD-10-CM | POA: Diagnosis not present

## 2018-05-05 DIAGNOSIS — D123 Benign neoplasm of transverse colon: Secondary | ICD-10-CM | POA: Insufficient documentation

## 2018-05-05 DIAGNOSIS — D125 Benign neoplasm of sigmoid colon: Secondary | ICD-10-CM

## 2018-05-05 DIAGNOSIS — K621 Rectal polyp: Secondary | ICD-10-CM | POA: Diagnosis not present

## 2018-05-05 DIAGNOSIS — I10 Essential (primary) hypertension: Secondary | ICD-10-CM | POA: Insufficient documentation

## 2018-05-05 HISTORY — PX: COLONOSCOPY WITH PROPOFOL: SHX5780

## 2018-05-05 SURGERY — COLONOSCOPY WITH PROPOFOL
Anesthesia: General

## 2018-05-05 MED ORDER — PROPOFOL 500 MG/50ML IV EMUL
INTRAVENOUS | Status: DC | PRN
  Administered 2018-05-05: 120 ug/kg/min via INTRAVENOUS

## 2018-05-05 MED ORDER — SODIUM CHLORIDE 0.9 % IV SOLN
INTRAVENOUS | Status: DC
  Administered 2018-05-05: 1000 mL via INTRAVENOUS

## 2018-05-05 MED ORDER — LIDOCAINE HCL (PF) 2 % IJ SOLN
INTRAMUSCULAR | Status: AC
  Filled 2018-05-05: qty 10

## 2018-05-05 MED ORDER — PROPOFOL 10 MG/ML IV BOLUS
INTRAVENOUS | Status: DC | PRN
  Administered 2018-05-05: 80 mg via INTRAVENOUS

## 2018-05-05 MED ORDER — PROPOFOL 500 MG/50ML IV EMUL
INTRAVENOUS | Status: AC
  Filled 2018-05-05: qty 50

## 2018-05-05 NOTE — Anesthesia Postprocedure Evaluation (Signed)
Anesthesia Post Note  Patient: Kenyonna Micek  Procedure(s) Performed: COLONOSCOPY WITH PROPOFOL (N/A )  Patient location during evaluation: Endoscopy Anesthesia Type: General Level of consciousness: awake and alert Pain management: pain level controlled Vital Signs Assessment: post-procedure vital signs reviewed and stable Respiratory status: spontaneous breathing, nonlabored ventilation, respiratory function stable and patient connected to nasal cannula oxygen Cardiovascular status: blood pressure returned to baseline and stable Postop Assessment: no apparent nausea or vomiting Anesthetic complications: no     Last Vitals:  Vitals:   05/05/18 0948 05/05/18 0958  BP: 125/89 (!) 135/91  Pulse: 70 73  Resp: 16 15  Temp:    SpO2: 100% 100%    Last Pain:  Vitals:   05/05/18 0958  TempSrc:   PainSc: 0-No pain                 Precious Haws Piscitello

## 2018-05-05 NOTE — Transfer of Care (Signed)
Immediate Anesthesia Transfer of Care Note  Patient: Linda Guzman  Procedure(s) Performed: COLONOSCOPY WITH PROPOFOL (N/A )  Patient Location: Endoscopy Unit  Anesthesia Type:General  Level of Consciousness: drowsy and patient cooperative  Airway & Oxygen Therapy: Patient Spontanous Breathing and Patient connected to nasal cannula oxygen  Post-op Assessment: Report given to RN and Post -op Vital signs reviewed and stable  Post vital signs: Reviewed and stable  Last Vitals:  Vitals Value Taken Time  BP 107/66 05/05/2018  9:18 AM  Temp    Pulse 70 05/05/2018  9:18 AM  Resp 8 05/05/2018  9:18 AM  SpO2 100 % 05/05/2018  9:18 AM  Vitals shown include unvalidated device data.  Last Pain:  Vitals:   05/05/18 0826  TempSrc: Tympanic  PainSc: 0-No pain         Complications: No apparent anesthesia complications

## 2018-05-05 NOTE — H&P (Signed)
Linda Lame, MD [facility] [address] [place], [place] [address] Phone: [phone] Fax : [phone]  Primary Care Physician:  Mikey College, NP Primary Gastroenterologist:  Dr. Allen Norris  Pre-Procedure History & Physical: HPI:  Linda Guzman is a 62 y.o. female is here for a screening colonoscopy.   Past Medical History:  Diagnosis Date  . Hypertension     Past Surgical History:  Procedure Laterality Date  . NO PAST SURGERIES      Prior to Admission medications   Medication Sig Start Date End Date Taking? Authorizing Provider  amLODipine (NORVASC) 10 MG tablet Take 1 tablet (10 mg total) by mouth daily. 04/24/18   Mikey College, NP  cholecalciferol (VITAMIN D) 1000 units tablet Take 1,000 Units by mouth daily.    [provider]  hydrochlorothiazide (HYDRODIURIL) 50 MG tablet Take 1 tablet (50 mg total) by mouth daily. 04/24/18   Mikey College, NP  magnesium oxide (MAG-OX) 400 MG tablet Take 400 mg by mouth daily.    [provider]    Allergies as of 04/10/2018 - Review Complete 04/09/2018  Allergen Reaction Noted  . Chloroquine Itching 12/11/2016    Family History  Problem Relation Age of Onset  . Hypertension Mother   . Stroke Sister     Social History   Socioeconomic History  . Marital status: Married    Spouse name: Not on file  . Number of children: Not on file  . Years of education: Not on file  . Highest education level: Not on file  Occupational History  . Not on file  Social Needs  . Financial resource strain: Not on file  . Food insecurity:    Worry: Not on file    Inability: Not on file  . Transportation needs:    Medical: Not on file    Non-medical: Not on file  Tobacco Use  . Smoking status: Never Smoker  . Smokeless tobacco: Never Used  Substance and Sexual Activity  . Alcohol use: No  . Drug use: No  . Sexual activity: Yes    Birth control/protection: Post-menopausal  Lifestyle  . Physical  activity:    Days per week: Not on file    Minutes per session: Not on file  . Stress: Not on file  Relationships  . Social connections:    Talks on phone: Not on file    Gets together: Not on file    Attends religious service: Not on file    Active member of club or organization: Not on file    Attends meetings of clubs or organizations: Not on file    Relationship status: Not on file  . Intimate partner violence:    Fear of current or ex partner: Not on file    Emotionally abused: Not on file    Physically abused: Not on file    Forced sexual activity: Not on file  Other Topics Concern  . Not on file  Social History Narrative  . Not on file    Review of Systems: See HPI, otherwise negative ROS  Physical Exam: BP 123/90   Pulse 70   Temp 98.2 F (36.8 C) (Tympanic)   Resp 18   Ht 5\' 8"  (1.727 m)   Wt 81.6 kg   SpO2 100%   BMI 27.37 kg/m  General:   Alert,  pleasant and cooperative in NAD Head:  Normocephalic and atraumatic. Neck:  Supple; no masses or thyromegaly. Lungs:  Clear throughout to  auscultation.    Heart:  Regular rate and rhythm. Abdomen:  Soft, nontender and nondistended. Normal bowel sounds, without guarding, and without rebound.   Neurologic:  Alert and  oriented x4;  grossly normal neurologically.  Impression/Plan: Linda Guzman is now here to undergo a screening colonoscopy.  Risks, benefits, and alternatives regarding colonoscopy have been reviewed with the patient.  Questions have been answered.  All parties agreeable.

## 2018-05-05 NOTE — Anesthesia Preprocedure Evaluation (Signed)
Anesthesia Evaluation  Patient identified by MRN, date of birth, ID band Patient awake    Reviewed: Allergy & Precautions, H&P , NPO status , Patient's Chart, lab work & pertinent test results  History of Anesthesia Complications Negative for: history of anesthetic complications  Airway Mallampati: III  TM Distance: >3 FB Neck ROM: full    Dental  (+) Chipped   Pulmonary neg pulmonary ROS, neg shortness of breath,           Cardiovascular Exercise Tolerance: Good hypertension, (-) angina(-) Past MI and (-) DOE      Neuro/Psych negative neurological ROS  negative psych ROS   GI/Hepatic negative GI ROS, Neg liver ROS, neg GERD  ,  Endo/Other  negative endocrine ROS  Renal/GU negative Renal ROS  negative genitourinary   Musculoskeletal   Abdominal   Peds  Hematology negative hematology ROS (+)   Anesthesia Other Findings Past Medical History: No date: Hypertension  Past Surgical History: No date: NO PAST SURGERIES  BMI    Body Mass Index:  27.37 kg/m      Reproductive/Obstetrics negative OB ROS                             Anesthesia Physical Anesthesia Plan  ASA: II  Anesthesia Plan: General   Post-op Pain Management:    Induction: Intravenous  PONV Risk Score and Plan: Propofol infusion and TIVA  Airway Management Planned: Natural Airway and Nasal Cannula  Additional Equipment:   Intra-op Plan:   Post-operative Plan:   Informed Consent: I have reviewed the patients History and Physical, chart, labs and discussed the procedure including the risks, benefits and alternatives for the proposed anesthesia with the patient or authorized representative who has indicated his/her understanding and acceptance.   Dental Advisory Given  Plan Discussed with: Anesthesiologist, CRNA and Surgeon  Anesthesia Plan Comments: (Patient consented for risks of anesthesia including but  not limited to:  - adverse reactions to medications - risk of intubation if required - damage to teeth, lips or other oral mucosa - sore throat or hoarseness - Damage to heart, brain, lungs or loss of life  Patient voiced understanding.)        Anesthesia Quick Evaluation

## 2018-05-05 NOTE — Anesthesia Post-op Follow-up Note (Signed)
Anesthesia QCDR form completed.        

## 2018-05-05 NOTE — Op Note (Signed)
[facility] Patient Name: Linda Guzman Procedure Date: 05/05/2018 8:43 AM MRN: [id number] Account #: [id number] Date of Birth: Sep 15, 1955 Admit Type: Outpatient Age: 62 Room: [facility] ENDO ROOM 4 Gender: Female Note Status: Finalized Procedure:            Colonoscopy Indications:          Screening for colorectal malignant neoplasm Providers:            Lucilla Lame MD, MD Referring MD:         No Local Md, MD (Referring MD) Medicines:            Propofol per Anesthesia Complications:        No immediate complications. Procedure:            Pre-Anesthesia Assessment:                       - Prior to the procedure, a History and Physical was                        performed, and patient medications and allergies were                        reviewed. The patient's tolerance of previous                        anesthesia was also reviewed. The risks and benefits of                        the procedure and the sedation options and risks were                        discussed with the patient. All questions were                        answered, and informed consent was obtained. Prior                        Anticoagulants: The patient has taken no previous                        anticoagulant or antiplatelet agents. ASA Grade                        Assessment: II - A patient with mild systemic disease.                        After reviewing the risks and benefits, the patient was                        deemed in satisfactory condition to undergo the                        procedure.                       After obtaining informed consent, the colonoscope was                        passed under direct vision. Throughout the procedure,  the patient's blood pressure, pulse, and oxygen                        saturations were monitored continuously. The                        Colonoscope was introduced through the anus and     advanced to the the cecum, identified by appendiceal                        orifice and ileocecal valve. The colonoscopy was                        performed without difficulty. The patient tolerated the                        procedure well. The quality of the bowel preparation                        was excellent. Findings:      The perianal and digital rectal examinations were normal.      A 3 mm polyp was found in the sigmoid colon. The polyp was sessile. The       polyp was removed with a cold snare. Resection and retrieval were       complete.      A 3 mm polyp was found in the descending colon. The polyp was sessile.       The polyp was removed with a cold snare. Resection and retrieval were       complete.      A 3 mm polyp was found in the transverse colon. The polyp was sessile.       The polyp was removed with a cold snare. Resection and retrieval were       complete.      A 15 mm polyp was found in the rectum. The polyp was sessile.       Polypectomy was not attempted due to polyp size (too large to be       excised). Impression:           - One 3 mm polyp in the sigmoid colon, removed with a                        cold snare. Resected and retrieved.                       - One 3 mm polyp in the descending colon, removed with                        a cold snare. Resected and retrieved.                       - One 3 mm polyp in the transverse colon, removed with                        a cold snare. Resected and retrieved.                       - One 15 mm polyp in the rectum. Resection not  attempted. Recommendation:       - Discharge patient to home.                       - Resume previous diet.                       - Continue present medications.                       - Refer to a Psychologist, sport and exercise. Procedure Code(s):    --- Professional ---                       [phone], Colonoscopy, flexible; with removal of tumor(s),                        polyp(s), or  other lesion(s) by snare technique Diagnosis Code(s):    --- Professional ---                       Z12.11, Encounter for screening for malignant neoplasm                        of colon                       D12.5, Benign neoplasm of sigmoid colon                       D12.4, Benign neoplasm of descending colon                       D12.3, Benign neoplasm of transverse colon (hepatic                        flexure or splenic flexure)                       K62.1, Rectal polyp CPT copyright 2017 American Medical Association. All rights reserved. The codes documented in this report are preliminary and upon coder review may  be revised to meet current compliance requirements. Lucilla Lame MD, MD 05/05/2018 9:14:00 AM This report has been signed electronically. Number of Addenda: 0 Note Initiated On: 05/05/2018 8:43 AM Scope Withdrawal Time: 0 hours 7 minutes 11 seconds  Total Procedure Duration: 0 hours 14 minutes 33 seconds       [facility]

## 2018-05-06 LAB — SURGICAL PATHOLOGY

## 2018-05-08 ENCOUNTER — Telehealth: Payer: Self-pay

## 2018-05-08 NOTE — Telephone Encounter (Signed)
Pt scheduled with [facility] with Dr, Nadeen Landau on Wednesday, Sept 25th @ 2:30pm.

## 2018-05-08 NOTE — Telephone Encounter (Signed)
-----   Message from Lucilla Lame, MD sent at 05/07/2018  7:52 AM EDT ----- This patient should be seen by surgery (Dr. Dema Severin) for large polyp removal. Should have appointment set up by endo already. Please check. Repeat colon in 1 year.

## 2018-05-19 ENCOUNTER — Other Ambulatory Visit: Payer: Self-pay | Admitting: Surgery

## 2018-05-19 DIAGNOSIS — K621 Rectal polyp: Secondary | ICD-10-CM

## 2018-06-04 ENCOUNTER — Ambulatory Visit
Admission: RE | Admit: 2018-06-04 | Discharge: 2018-06-04 | Disposition: A | Discharge status: Home / Self Care | Payer: BLUE CROSS/BLUE SHIELD | Source: Ambulatory Visit | Attending: Surgery | Admitting: Surgery

## 2018-06-04 DIAGNOSIS — K621 Rectal polyp: Secondary | ICD-10-CM

## 2018-06-04 MED ORDER — GADOBENATE DIMEGLUMINE 529 MG/ML IV SOLN
17.0000 mL | Freq: Once | INTRAVENOUS | Status: AC | PRN
  Administered 2018-06-04: 17 mL via INTRAVENOUS

## 2018-07-14 ENCOUNTER — Ambulatory Visit: Payer: Self-pay | Admitting: Surgery

## 2018-07-14 NOTE — H&P (Signed)
CC: Referred by Dr. Allen Norris for evaluation of a rectal polyp  HPI: Linda Guzman is a very pleasant 62 year old female whom has a history of hypertension and underwent her first colonoscopy for screening purposes on 05/05/18 with Dr. Allen Norris. Findings notable for a 3 mm polyp in the sigmoid, descending, and transverse colon. These returned as a tubular adenoma. There was a 1.5 cm polyp found in the distal rectum which was biopsied and returned as a hyperplastic polyp-negative for dysplasia or malignancy. She denies any tissue prolapse per rectum or blood in her stool. She denies any issues with incontinence to flatus, liquid or solid stool.  INTERVAL HX She had left the country for vacation and returns today for follow-up. She underwent rectal MRI 06/04/18 which demonstrated a subtle lesion in the anterior wall of the distal rectum just proximal to the anal verge that was 18 x 6 x 12 mm in size. There is no extension through the muscularis propria noted. There are no suspicious perirectal lymph nodes. She denies any complaints today and is now ready to schedule her procedure  PMH: HTN (well controlled on oral antihypertensive)  PSH: Denies any prior surgeries  FHx: Denies FHx of malignancy  Social: Denies use of tobacco/EtOH/drugs. 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  The patient is a 62 year old female.   Allergies Linda Guzman, Linda Guzman; 07/14/2018 3:07 PM) Chloroquine Phosphate *ANTIMALARIALS*  Allergies Reconciled   Medication History Linda Guzman, RMA; 07/14/2018 3:07 PM) amLODIPine Besylate (10MG  Tablet, Oral) Active. hydroCHLOROthiazide (50MG  Tablet, Oral) Active. Medications Reconciled    Review of Systems Linda Gave M. Abdulrahim Siddiqi MD; 07/14/2018 3:33 PM) General Not Present- Appetite Loss, Chills, Fatigue, Fever, Night Sweats, Weight Gain and  Weight Loss. HEENT Not Present- Earache, Hearing Loss, Hoarseness, Nose Bleed, Oral Ulcers, Ringing in the Ears, Seasonal Allergies, Sinus Pain, Sore Throat, Visual Disturbances, Wears glasses/contact lenses and Yellow Eyes. Respiratory Not Present- Bloody sputum, Chronic Cough, Difficulty Breathing, Snoring and Wheezing. Breast Not Present- Breast Mass, Breast Pain, Nipple Discharge and Skin Changes. Cardiovascular Not Present- Chest Pain, Difficulty Breathing Lying Down, Leg Cramps, Palpitations, Rapid Heart Rate, Shortness of Breath and Swelling of Extremities. Gastrointestinal Not Present- Abdominal Pain, Bloating, Bloody Stool, Change in Bowel Habits, Chronic diarrhea, Constipation, Difficulty Swallowing, Excessive gas, Gets full quickly at meals, Hemorrhoids, Indigestion, Nausea, Rectal Pain and Vomiting. Female Genitourinary Not Present- Frequency, Nocturia, Painful Urination, Pelvic Pain and Urgency. Musculoskeletal Not Present- Back Pain, Joint Pain, Joint Stiffness, Muscle Pain, Muscle Weakness and Swelling of Extremities. Neurological Not Present- Decreased Memory, Fainting, Headaches, Numbness, Seizures, Tingling, Tremor, Trouble walking and Weakness. Psychiatric Not Present- Anxiety, Bipolar, Change in Sleep Pattern, Depression, Fearful and Frequent crying. Endocrine Not Present- Cold Intolerance, Excessive Hunger, Hair Changes, Heat Intolerance, Hot flashes and New Diabetes. Hematology Not Present- Blood Thinners, Easy Bruising, Excessive bleeding, Gland problems, HIV and Persistent Infections.  Vitals Linda Guzman RMA; 07/14/2018 3:07 PM) 07/14/2018 3:07 PM Weight: 189.4 lb Height: 68in Body Surface Area: 2 m Body Mass Index: 28.8 kg/m  Temp.: 98.53F  Pulse: 96 (Regular)  BP: 138/72 (Sitting, Left Arm, Standard)       Physical Exam Linda Gave M. Florella Mcneese MD; 07/14/2018 3:33 PM) The physical exam findings are as follows: Note:Constitutional: No acute distress;  conversant; no deformities Eyes: Moist conjunctiva; no lid lag; anicteric sclerae; pupils equal round and reactive to light Neck: Trachea midline;  no palpable thyromegaly Lungs: Normal respiratory effort; no tactile fremitus CV: Regular rate and rhythm; no palpable thrill; no pitting edema GI: Abdomen soft, nontender, nondistended; no palpable hepatosplenomegaly Anorectal: Small external skin tags; DRE - no large masses; probable soft frond like mass palpable on anterior wall Anoscopy: Candidate polypoid structure anterior based. Soft fronds MSK: Normal gait; no clubbing/cyanosis Psychiatric: Appropriate affect; alert and oriented 3 Lymphatic: No palpable cervical or axillary lymphadenopathy **A chaperone, Linda Guzman, was present for the entire physical exam    Assessment & Plan Linda Gave M. Tineshia Becraft MD; 07/14/2018 3:35 PM) RECTAL POLYP (K62.1) Story: [name] is a very pleasant 61yoF with hx of HTN here today with polypoid lesion in distal rectum - biopsies thus far benign and show hyperplastic polyp  MRI - no evidence of suspicious lymph nodes; no extension through muscularis propria Impression: -The anatomy and physiology of the anal canal was discussed at length with the patient again today. The pathophysiology of rectal polyps and cancer was discussed at length with associated pictures and illustrations. -She has previously elected for removal. -We discussed transanal minimally invasive surgery and transanal extraction -The planned procedure, material risks (including, but not limited to, pain, bleeding, infection, scarring, need for blood transfusion, damage to anal sphincter, incontinence of gas and/or stool, need for additional procedures, recurrence, pneumonia, heart attack, stroke, death) benefits and alternatives to surgery were discussed at length. The patient's questions were answered to her satisfaction, she voiced understanding and elected to proceed with surgery.  Additionally, we discussed typical postoperative expectations and the recovery process.  Signed electronically by Linda Roup, MD (07/14/2018 3:35 PM)

## 2018-07-20 ENCOUNTER — Ambulatory Visit (INDEPENDENT_AMBULATORY_CARE_PROVIDER_SITE_OTHER): Payer: BLUE CROSS/BLUE SHIELD

## 2018-07-20 DIAGNOSIS — Z23 Encounter for immunization: Secondary | ICD-10-CM | POA: Diagnosis not present

## 2018-07-20 NOTE — Patient Instructions (Addendum)
Linda Guzman  07/20/2018   Your procedure is scheduled on: Monday 07/27/2018  Report to [facility] Main  Entrance              Report to admitting at  0800  AM    Call this number if you have problems the morning of surgery [phone]              Remember : Do not eat any foods after midnight Saturday 07/25/2018! You will be drinking ONLY clear liquids on Sunday 07/26/2018!              Follow Bowel Prep instructions from Dr. Orest Dikes office the day before surgery on Sunday 07/26/2018 and drink only clear liquids all day.  DRINK 2 PRESURGERY ENSURE DRINKS THE NIGHT BEFORE SURGERY AT  1000 PM AND 1 PRESURGERY DRINK THE DAY OF THE PROCEDURE 3 HOURS PRIOR TO SCHEDULED SURGERY. NO SOLIDS AFTER MIDNIGHT THE DAY PRIOR TO THE SURGERY. NOTHING BY MOUTH EXCEPT CLEAR LIQUIDS UNTIL THREE HOURS PRIOR TO SCHEDULED SURGERY. PLEASE FINISH PRESURGERY ENSURE DRINK PER SURGEON ORDER 3 HOURS PRIOR TO SCHEDULED SURGERY TIME WHICH NEEDS TO BE COMPLETED AT  0700 am.                CLEAR LIQUID DIET   Foods Allowed                                                                     Foods Excluded  Coffee and tea, regular and decaf                             liquids that you cannot  Plain Jell-O in any flavor                                             see through such as: Fruit ices (not with fruit pulp)                                     milk, soups, orange juice  Iced Popsicles                                    All solid food Carbonated beverages, regular and diet                                    Cranberry, grape and apple juices Sports drinks like Gatorade Lightly seasoned clear broth or consume(fat free) Sugar, honey syrup  Sample Menu Breakfast                                Lunch  Supper Cranberry juice                    Beef broth                            Chicken broth Jell-O                                     Grape juice                            Apple juice Coffee or tea                        Jell-O                                      Popsicle                                                Coffee or tea                        Coffee or tea  _____________________________________________________________________                  BRUSH YOUR TEETH MORNING OF SURGERY AND RINSE YOUR MOUTH OUT, NO CHEWING GUM CANDY OR MINTS.     Take these medicines the morning of surgery with A SIP OF WATER: Amlodipine (Norvasc)                                 You may not have any metal on your body including hair pins and              piercings  Do not wear jewelry, make-up, lotions, powders or perfumes, deodorant             Men may shave face and neck.   Do not bring valuables to the hospital. [place].  Contacts, dentures or bridgework may not be worn into surgery.  Leave suitcase in the car. After surgery it may be brought to your room.                  Please read over the following fact sheets you were given: _____________________________________________________________________             [facility] - Preparing for Surgery Before surgery, you can play an important role.  Because skin is not sterile, your skin needs to be as free of germs as possible.  You can reduce the number of germs on your skin by washing with CHG (chlorahexidine gluconate) soap before surgery.  CHG is an antiseptic cleaner which kills germs and bonds with the skin to continue killing germs even after washing. Please DO NOT use if you have an allergy to CHG or antibacterial soaps.  If your skin becomes reddened/irritated stop using the CHG and inform your nurse  when you arrive at Short Stay. Do not shave (including legs and underarms) for at least 48 hours prior to the first CHG shower.  You may shave your face/neck. Please follow these instructions carefully:  1.  Shower with CHG Soap the  night before surgery and the  morning of Surgery.  2.  If you choose to wash your hair, wash your hair first as usual with your  normal  shampoo.  3.  After you shampoo, rinse your hair and body thoroughly to remove the  shampoo.                           4.  Use CHG as you would any other liquid soap.  You can apply chg directly  to the skin and wash                       Gently with a scrungie or clean washcloth.  5.  Apply the CHG Soap to your body ONLY FROM THE NECK DOWN.   Do not use on face/ open                           Wound or open sores. Avoid contact with eyes, ears mouth and genitals (private parts).                       Wash face,  Genitals (private parts) with your normal soap.             6.  Wash thoroughly, paying special attention to the area where your surgery  will be performed.  7.  Thoroughly rinse your body with warm water from the neck down.  8.  DO NOT shower/wash with your normal soap after using and rinsing off  the CHG Soap.                9.  Pat yourself dry with a clean towel.            10.  Wear clean pajamas.            11.  Place clean sheets on your bed the night of your first shower and do not  sleep with pets. Day of Surgery : Do not apply any lotions/deodorants the morning of surgery.  Please wear clean clothes to the hospital/surgery center.  FAILURE TO FOLLOW THESE INSTRUCTIONS MAY RESULT IN THE CANCELLATION OF YOUR SURGERY PATIENT SIGNATURE_________________________________  NURSE SIGNATURE__________________________________  ________________________________________________________________________   [name]  An incentive spirometer is a tool that can help keep your lungs clear and active. This tool measures how well you are filling your lungs with each breath. Taking long deep breaths may help reverse or decrease the chance of developing breathing (pulmonary) problems (especially infection) following:  A long period of time when you  are unable to move or be active. BEFORE THE PROCEDURE   If the spirometer includes an indicator to show your best effort, your nurse or respiratory therapist will set it to a desired goal.  If possible, sit up straight or lean slightly forward. Try not to slouch.  Hold the incentive spirometer in an upright position. INSTRUCTIONS FOR USE  1. Sit on the edge of your bed if possible, or sit up as far as you can in bed or on a chair. 2. Hold the incentive spirometer in an upright position. 3. Breathe  out normally. 4. Place the mouthpiece in your mouth and seal your lips tightly around it. 5. Breathe in slowly and as deeply as possible, raising the piston or the ball toward the top of the column. 6. Hold your breath for 3-5 seconds or for as long as possible. Allow the piston or ball to fall to the bottom of the column. 7. Remove the mouthpiece from your mouth and breathe out normally. 8. Rest for a few seconds and repeat Steps 1 through 7 at least 10 times every 1-2 hours when you are awake. Take your time and take a few normal breaths between deep breaths. 9. The spirometer may include an indicator to show your best effort. Use the indicator as a goal to work toward during each repetition. 10. After each set of 10 deep breaths, practice coughing to be sure your lungs are clear. If you have an incision (the cut made at the time of surgery), support your incision when coughing by placing a pillow or rolled up towels firmly against it. Once you are able to get out of bed, walk around indoors and cough well. You may stop using the incentive spirometer when instructed by your caregiver.  RISKS AND COMPLICATIONS  Take your time so you do not get dizzy or light-headed.  If you are in pain, you may need to take or ask for pain medication before doing incentive spirometry. It is harder to take a deep breath if you are having pain. AFTER USE  Rest and breathe slowly and easily.  It can be helpful to  keep track of a log of your progress. Your caregiver can provide you with a simple table to help with this. If you are using the spirometer at home, follow these instructions: [place] IF:   You are having difficultly using the spirometer.  You have trouble using the spirometer as often as instructed.  Your pain medication is not giving enough relief while using the spirometer.  You develop fever of 100.5 F (38.1 C) or higher. SEEK IMMEDIATE MEDICAL CARE IF:   You cough up bloody sputum that had not been present before.  You develop fever of 102 F (38.9 C) or greater.  You develop worsening pain at or near the incision site. MAKE SURE YOU:   Understand these instructions.  Will watch your condition.  Will get help right away if you are not doing well or get worse. Document Released: 12/16/2006 Document Revised: 10/28/2011 Document Reviewed: 02/16/2007 ExitCare Patient Information 2014 [employer], [place].   ________________________________________________________________________  WHAT IS A BLOOD TRANSFUSION? Blood Transfusion Information  A transfusion is the replacement of blood or some of its parts. Blood is made up of multiple cells which provide different functions.  Red blood cells carry oxygen and are used for blood loss replacement.  White blood cells fight against infection.  Platelets control bleeding.  Plasma helps clot blood.  Other blood products are available for specialized needs, such as hemophilia or other clotting disorders. BEFORE THE TRANSFUSION  Who gives blood for transfusions?   Healthy volunteers who are fully evaluated to make sure their blood is safe. This is blood bank blood. Transfusion therapy is the safest it has ever been in the practice of medicine. Before blood is taken from a donor, a complete history is taken to make sure that person has no history of diseases nor engages in risky social behavior (examples are intravenous drug  use or sexual activity with multiple partners). The donor's  travel history is screened to minimize risk of transmitting infections, such as malaria. The donated blood is tested for signs of infectious diseases, such as HIV and hepatitis. The blood is then tested to be sure it is compatible with you in order to minimize the chance of a transfusion reaction. If you or a relative donates blood, this is often done in anticipation of surgery and is not appropriate for emergency situations. It takes many days to process the donated blood. RISKS AND COMPLICATIONS Although transfusion therapy is very safe and saves many lives, the main dangers of transfusion include:   Getting an infectious disease.  Developing a transfusion reaction. This is an allergic reaction to something in the blood you were given. Every precaution is taken to prevent this. The decision to have a blood transfusion has been considered carefully by your caregiver before blood is given. Blood is not given unless the benefits outweigh the risks. AFTER THE TRANSFUSION  Right after receiving a blood transfusion, you will usually feel much better and more energetic. This is especially true if your red blood cells have gotten low (anemic). The transfusion raises the level of the red blood cells which carry oxygen, and this usually causes an energy increase.  The nurse administering the transfusion will monitor you carefully for complications. HOME CARE INSTRUCTIONS  No special instructions are needed after a transfusion. You may find your energy is better. Speak with your caregiver about any limitations on activity for underlying diseases you may have. SEEK MEDICAL CARE IF:   Your condition is not improving after your transfusion.  You develop redness or irritation at the intravenous (IV) site. SEEK IMMEDIATE MEDICAL CARE IF:  Any of the following symptoms occur over the next 12 hours:  Shaking chills.  You have a temperature by mouth  above 102 F (38.9 C), not controlled by medicine.  Chest, back, or muscle pain.  People around you feel you are not acting correctly or are confused.  Shortness of breath or difficulty breathing.  Dizziness and fainting.  You get a rash or develop hives.  You have a decrease in urine output.  Your urine turns a dark color or changes to pink, red, or brown. Any of the following symptoms occur over the next 10 days:  You have a temperature by mouth above 102 F (38.9 C), not controlled by medicine.  Shortness of breath.  Weakness after normal activity.  The white part of the eye turns yellow (jaundice).  You have a decrease in the amount of urine or are urinating less often.  Your urine turns a dark color or changes to pink, red, or brown. Document Released: 08/02/2000 Document Revised: 10/28/2011 Document Reviewed: 03/21/2008 [facility] Patient Information 2014 [place], [place].  _______________________________________________________________________

## 2018-07-20 NOTE — Patient Instructions (Signed)

## 2018-07-21 ENCOUNTER — Other Ambulatory Visit: Payer: Self-pay

## 2018-07-21 ENCOUNTER — Encounter (HOSPITAL_COMMUNITY)
Admission: RE | Admit: 2018-07-21 | Discharge: 2018-07-21 | Disposition: A | Discharge status: Home / Self Care | Payer: BLUE CROSS/BLUE SHIELD | Source: Ambulatory Visit | Attending: Surgery | Admitting: Surgery

## 2018-07-21 ENCOUNTER — Encounter (HOSPITAL_COMMUNITY): Payer: Self-pay

## 2018-07-21 DIAGNOSIS — Z01818 Encounter for other preprocedural examination: Secondary | ICD-10-CM | POA: Diagnosis not present

## 2018-07-21 DIAGNOSIS — K621 Rectal polyp: Secondary | ICD-10-CM | POA: Insufficient documentation

## 2018-07-21 LAB — CBC WITH DIFFERENTIAL/PLATELET
Abs Immature Granulocytes: 0.01 10*3/uL (ref 0.00–0.07)
BASOS ABS: 0 10*3/uL (ref 0.0–0.1)
Basophils Relative: 1 %
Eosinophils Absolute: 0.1 10*3/uL (ref 0.0–0.5)
Eosinophils Relative: 2 %
HCT: 44.8 % (ref 36.0–46.0)
Hemoglobin: 13.8 g/dL (ref 12.0–15.0)
Immature Granulocytes: 0 %
Lymphocytes Relative: 33 %
Lymphs Abs: 2 10*3/uL (ref 0.7–4.0)
MCH: 27.4 pg (ref 26.0–34.0)
MCHC: 30.8 g/dL (ref 30.0–36.0)
MCV: 88.9 fL (ref 80.0–100.0)
Monocytes Absolute: 0.4 10*3/uL (ref 0.1–1.0)
Monocytes Relative: 7 %
Neutro Abs: 3.6 10*3/uL (ref 1.7–7.7)
Neutrophils Relative %: 57 %
Platelets: 254 10*3/uL (ref 150–400)
RBC: 5.04 MIL/uL (ref 3.87–5.11)
RDW: 14.1 % (ref 11.5–15.5)
WBC: 6.2 10*3/uL (ref 4.0–10.5)
nRBC: 0 % (ref 0.0–0.2)

## 2018-07-21 LAB — COMPREHENSIVE METABOLIC PANEL
ALBUMIN: 4.1 g/dL (ref 3.5–5.0)
ALT: 32 U/L (ref 0–44)
AST: 28 U/L (ref 15–41)
Alkaline Phosphatase: 51 U/L (ref 38–126)
Anion gap: 11 (ref 5–15)
BUN: 16 mg/dL (ref 8–23)
CO2: 30 mmol/L (ref 22–32)
Calcium: 9.8 mg/dL (ref 8.9–10.3)
Chloride: 99 mmol/L (ref 98–111)
Creatinine, Ser: 0.94 mg/dL (ref 0.44–1.00)
GFR calc Af Amer: >60 mL/min (ref >60)
GFR calc non Af Amer: >60 mL/min (ref >60)
GLUCOSE: 186 mg/dL — AB (ref 70–99)
Potassium: 3.3 mmol/L — ABNORMAL LOW (ref 3.5–5.1)
Sodium: 140 mmol/L (ref 135–145)
Total Bilirubin: 0.7 mg/dL (ref 0.3–1.2)
Total Protein: 7.8 g/dL (ref 6.5–8.1)

## 2018-07-21 LAB — ABO/RH: ABO/RH(D): AB POS

## 2018-07-21 LAB — PROTIME-INR
INR: 0.93
Prothrombin Time: 12.4 seconds (ref 11.4–15.2)

## 2018-07-21 LAB — APTT: aPTT: 30 seconds (ref 24–36)

## 2018-07-21 MED ORDER — CHLORHEXIDINE GLUCONATE CLOTH 2 % EX PADS
6.0000 | MEDICATED_PAD | Freq: Once | CUTANEOUS | Status: DC
  Filled 2018-07-21: qty 6

## 2018-07-21 NOTE — Progress Notes (Signed)
Pt speaks English well, but prefers [place].  Pt signed interpreter refusal form, and wants husband, Marijean Niemann, for all interpreting needs.

## 2018-07-22 LAB — HEMOGLOBIN A1C
HEMOGLOBIN A1C: 5.8 % — AB (ref 4.8–5.6)
Mean Plasma Glucose: 119.76 mg/dL

## 2018-07-26 NOTE — Anesthesia Preprocedure Evaluation (Addendum)
Anesthesia Evaluation  Patient identified by MRN, date of birth, ID band Patient awake    Reviewed: Allergy & Precautions, NPO status , Patient's Chart, lab work & pertinent test results  History of Anesthesia Complications Negative for: history of anesthetic complications  Airway Mallampati: III  TM Distance: >3 FB Neck ROM: Full    Dental  (+) Teeth Intact, Dental Advisory Given   Pulmonary neg pulmonary ROS,    Pulmonary exam normal breath sounds clear to auscultation       Cardiovascular hypertension, Pt. on medications Normal cardiovascular exam Rhythm:Regular Rate:Normal     Neuro/Psych negative neurological ROS     GI/Hepatic negative GI ROS, Neg liver ROS,   Endo/Other  negative endocrine ROS  Renal/GU negative Renal ROS     Musculoskeletal negative musculoskeletal ROS (+)   Abdominal   Peds  Hematology negative hematology ROS (+)   Anesthesia Other Findings Day of surgery medications reviewed with the patient.  Reproductive/Obstetrics                            Anesthesia Physical Anesthesia Plan  ASA: II  Anesthesia Plan: General   Post-op Pain Management:    Induction: Intravenous  PONV Risk Score and Plan: 3 and Treatment may vary due to age or medical condition, Ondansetron, Dexamethasone and Midazolam  Airway Management Planned: Oral ETT  Additional Equipment:   Intra-op Plan:   Post-operative Plan: Extubation in OR  Informed Consent: I have reviewed the patients History and Physical, chart, labs and discussed the procedure including the risks, benefits and alternatives for the proposed anesthesia with the patient or authorized representative who has indicated his/her understanding and acceptance.   Dental advisory given  Plan Discussed with: CRNA  Anesthesia Plan Comments:        Anesthesia Quick Evaluation

## 2018-07-27 ENCOUNTER — Ambulatory Visit (HOSPITAL_COMMUNITY): Payer: BLUE CROSS/BLUE SHIELD | Admitting: Anesthesiology

## 2018-07-27 ENCOUNTER — Encounter (HOSPITAL_COMMUNITY)
Admission: RE | Disposition: A | Discharge status: Home / Self Care | Payer: Self-pay | Source: Ambulatory Visit | Attending: Surgery

## 2018-07-27 ENCOUNTER — Ambulatory Visit (HOSPITAL_COMMUNITY)
Admission: RE | Admit: 2018-07-27 | Discharge: 2018-07-27 | Disposition: A | Discharge status: Home / Self Care | Payer: BLUE CROSS/BLUE SHIELD | Source: Ambulatory Visit | Attending: Surgery | Admitting: Surgery

## 2018-07-27 ENCOUNTER — Encounter (HOSPITAL_COMMUNITY): Payer: Self-pay | Admitting: *Deleted

## 2018-07-27 DIAGNOSIS — I10 Essential (primary) hypertension: Secondary | ICD-10-CM | POA: Diagnosis not present

## 2018-07-27 DIAGNOSIS — Z79899 Other long term (current) drug therapy: Secondary | ICD-10-CM | POA: Insufficient documentation

## 2018-07-27 DIAGNOSIS — D128 Benign neoplasm of rectum: Secondary | ICD-10-CM | POA: Insufficient documentation

## 2018-07-27 HISTORY — PX: TRANSANAL EXCISION OF RECTAL MASS: SHX6134

## 2018-07-27 HISTORY — PX: FLEXIBLE SIGMOIDOSCOPY: SHX5431

## 2018-07-27 LAB — TYPE AND SCREEN
ABO/RH(D): AB POS
Antibody Screen: NEGATIVE

## 2018-07-27 SURGERY — SIGMOIDOSCOPY, FLEXIBLE
Anesthesia: General

## 2018-07-27 MED ORDER — FENTANYL CITRATE (PF) 100 MCG/2ML IJ SOLN: 25.0000 ug | INTRAMUSCULAR | Status: DC | PRN

## 2018-07-27 MED ORDER — DEXAMETHASONE SODIUM PHOSPHATE 10 MG/ML IJ SOLN
INTRAMUSCULAR | Status: AC
  Filled 2018-07-27: qty 1

## 2018-07-27 MED ORDER — FENTANYL CITRATE (PF) 100 MCG/2ML IJ SOLN
INTRAMUSCULAR | Status: AC
  Filled 2018-07-27: qty 2

## 2018-07-27 MED ORDER — METRONIDAZOLE 500 MG PO TABS
1000.0000 mg | ORAL_TABLET | ORAL | Status: DC
  Filled 2018-07-27: qty 2

## 2018-07-27 MED ORDER — OXYCODONE HCL 5 MG PO TABS: 5.0000 mg | ORAL_TABLET | Freq: Once | ORAL | Status: DC | PRN

## 2018-07-27 MED ORDER — LIDOCAINE 2% (20 MG/ML) 5 ML SYRINGE
INTRAMUSCULAR | Status: DC | PRN
  Administered 2018-07-27: 60 mg via INTRAVENOUS

## 2018-07-27 MED ORDER — ONDANSETRON HCL 4 MG/2ML IJ SOLN
INTRAMUSCULAR | Status: DC | PRN
  Administered 2018-07-27: 4 mg via INTRAVENOUS

## 2018-07-27 MED ORDER — PHENYLEPHRINE 40 MCG/ML (10ML) SYRINGE FOR IV PUSH (FOR BLOOD PRESSURE SUPPORT)
PREFILLED_SYRINGE | INTRAVENOUS | Status: AC
  Filled 2018-07-27: qty 10

## 2018-07-27 MED ORDER — SUGAMMADEX SODIUM 200 MG/2ML IV SOLN
INTRAVENOUS | Status: AC
  Filled 2018-07-27: qty 2

## 2018-07-27 MED ORDER — EPHEDRINE SULFATE-NACL 50-0.9 MG/10ML-% IV SOSY
PREFILLED_SYRINGE | INTRAVENOUS | Status: DC | PRN
  Administered 2018-07-27: 5 mg via INTRAVENOUS
  Administered 2018-07-27: 10 mg via INTRAVENOUS
  Administered 2018-07-27: 5 mg via INTRAVENOUS

## 2018-07-27 MED ORDER — LACTATED RINGERS IV SOLN
INTRAVENOUS | Status: DC
  Administered 2018-07-27: 09:00:00 via INTRAVENOUS

## 2018-07-27 MED ORDER — ACETAMINOPHEN 10 MG/ML IV SOLN: 1000.0000 mg | Freq: Once | INTRAVENOUS | Status: DC | PRN

## 2018-07-27 MED ORDER — LIDOCAINE 2% (20 MG/ML) 5 ML SYRINGE
INTRAMUSCULAR | Status: AC
  Filled 2018-07-27: qty 5

## 2018-07-27 MED ORDER — 0.9 % SODIUM CHLORIDE (POUR BTL) OPTIME
TOPICAL | Status: DC | PRN
  Administered 2018-07-27: 1000 mL

## 2018-07-27 MED ORDER — ACETAMINOPHEN 500 MG PO TABS
1000.0000 mg | ORAL_TABLET | ORAL | Status: AC
  Administered 2018-07-27: 1000 mg via ORAL
  Filled 2018-07-27: qty 2

## 2018-07-27 MED ORDER — ROCURONIUM BROMIDE 10 MG/ML (PF) SYRINGE
PREFILLED_SYRINGE | INTRAVENOUS | Status: AC
  Filled 2018-07-27: qty 10

## 2018-07-27 MED ORDER — ROCURONIUM BROMIDE 10 MG/ML (PF) SYRINGE
PREFILLED_SYRINGE | INTRAVENOUS | Status: DC | PRN
  Administered 2018-07-27: 50 mg via INTRAVENOUS

## 2018-07-27 MED ORDER — BUPIVACAINE-EPINEPHRINE (PF) 0.25% -1:200000 IJ SOLN
INTRAMUSCULAR | Status: DC | PRN
  Administered 2018-07-27: 30 mL

## 2018-07-27 MED ORDER — PROMETHAZINE HCL 25 MG/ML IJ SOLN
6.2500 mg | INTRAMUSCULAR | Status: DC | PRN
  Administered 2018-07-27: 6.25 mg via INTRAVENOUS

## 2018-07-27 MED ORDER — FENTANYL CITRATE (PF) 100 MCG/2ML IJ SOLN
INTRAMUSCULAR | Status: DC | PRN
  Administered 2018-07-27 (×3): 50 ug via INTRAVENOUS

## 2018-07-27 MED ORDER — ONDANSETRON HCL 4 MG/2ML IJ SOLN
INTRAMUSCULAR | Status: AC
  Filled 2018-07-27: qty 2

## 2018-07-27 MED ORDER — SODIUM CHLORIDE 0.9 % IV SOLN
2.0000 g | INTRAVENOUS | Status: AC
  Administered 2018-07-27: 2 g via INTRAVENOUS
  Filled 2018-07-27: qty 2

## 2018-07-27 MED ORDER — SUGAMMADEX SODIUM 200 MG/2ML IV SOLN
INTRAVENOUS | Status: DC | PRN
  Administered 2018-07-27: 200 mg via INTRAVENOUS

## 2018-07-27 MED ORDER — OXYCODONE HCL 5 MG/5ML PO SOLN
5.0000 mg | Freq: Once | ORAL | Status: DC | PRN
  Filled 2018-07-27: qty 5

## 2018-07-27 MED ORDER — PROMETHAZINE HCL 25 MG/ML IJ SOLN
INTRAMUSCULAR | Status: AC
  Filled 2018-07-27: qty 1

## 2018-07-27 MED ORDER — MIDAZOLAM HCL 5 MG/5ML IJ SOLN
INTRAMUSCULAR | Status: DC | PRN
  Administered 2018-07-27: 2 mg via INTRAVENOUS

## 2018-07-27 MED ORDER — BUPIVACAINE-EPINEPHRINE (PF) 0.25% -1:200000 IJ SOLN
INTRAMUSCULAR | Status: AC
  Filled 2018-07-27: qty 30

## 2018-07-27 MED ORDER — GABAPENTIN 300 MG PO CAPS
300.0000 mg | ORAL_CAPSULE | ORAL | Status: AC
  Administered 2018-07-27: 300 mg via ORAL
  Filled 2018-07-27: qty 1

## 2018-07-27 MED ORDER — GLYCOPYRROLATE PF 0.2 MG/ML IJ SOSY
PREFILLED_SYRINGE | INTRAMUSCULAR | Status: DC | PRN
  Administered 2018-07-27: .2 mg via INTRAVENOUS

## 2018-07-27 MED ORDER — BUPIVACAINE LIPOSOME 1.3 % IJ SUSP
20.0000 mL | Freq: Once | INTRAMUSCULAR | Status: AC
  Administered 2018-07-27: 20 mL
  Filled 2018-07-27: qty 20

## 2018-07-27 MED ORDER — NEOMYCIN SULFATE 500 MG PO TABS
1000.0000 mg | ORAL_TABLET | ORAL | Status: DC
  Filled 2018-07-27: qty 2

## 2018-07-27 MED ORDER — PHENYLEPHRINE 40 MCG/ML (10ML) SYRINGE FOR IV PUSH (FOR BLOOD PRESSURE SUPPORT)
PREFILLED_SYRINGE | INTRAVENOUS | Status: DC | PRN
  Administered 2018-07-27 (×2): 80 ug via INTRAVENOUS

## 2018-07-27 MED ORDER — GLYCOPYRROLATE PF 0.2 MG/ML IJ SOSY
PREFILLED_SYRINGE | INTRAMUSCULAR | Status: AC
  Filled 2018-07-27: qty 1

## 2018-07-27 MED ORDER — PROPOFOL 10 MG/ML IV BOLUS
INTRAVENOUS | Status: DC | PRN
  Administered 2018-07-27: 150 mg via INTRAVENOUS

## 2018-07-27 MED ORDER — MIDAZOLAM HCL 2 MG/2ML IJ SOLN
INTRAMUSCULAR | Status: AC
  Filled 2018-07-27: qty 2

## 2018-07-27 MED ORDER — TRAMADOL HCL 50 MG PO TABS: 50.0000 mg | ORAL_TABLET | Freq: Four times a day (QID) | ORAL | 0 refills | Status: DC | PRN

## 2018-07-27 MED ORDER — DEXAMETHASONE SODIUM PHOSPHATE 4 MG/ML IJ SOLN
INTRAMUSCULAR | Status: DC | PRN
  Administered 2018-07-27: 5 mg via INTRAVENOUS

## 2018-07-27 MED ORDER — POLYETHYLENE GLYCOL 3350 17 GM/SCOOP PO POWD
1.0000 | Freq: Once | ORAL | Status: DC
  Filled 2018-07-27: qty 255

## 2018-07-27 SURGICAL SUPPLY — 42 items
BAG URO CATCHER STRL LF (MISCELLANEOUS) IMPLANT
BLADE EXTENDED COATED 6.5IN (ELECTRODE) IMPLANT
BRIEF STRETCH FOR OB PAD LRG (UNDERPADS AND DIAPERS) ×2 IMPLANT
CABLE HIGH FREQUENCY MONO STRZ (ELECTRODE) ×2 IMPLANT
COVER WAND RF STERILE (DRAPES) IMPLANT
DRAPE LAPAROTOMY T 102X78X121 (DRAPES) IMPLANT
DRAPE LAPAROTOMY T 98X78 PEDS (DRAPES) ×2 IMPLANT
DRAPE SURG IRRIG POUCH 19X23 (DRAPES) IMPLANT
ELECT PENCIL ROCKER SW 15FT (MISCELLANEOUS) ×2 IMPLANT
ELECT REM PT RETURN 15FT ADLT (MISCELLANEOUS) ×2 IMPLANT
GAUZE SPONGE 4X4 12PLY STRL (GAUZE/BANDAGES/DRESSINGS) ×2 IMPLANT
GLOVE BIO SURGEON STRL SZ 6.5 (GLOVE) IMPLANT
GLOVE BIO SURGEON STRL SZ7.5 (GLOVE) ×4 IMPLANT
GLOVE BIOGEL PI IND STRL 7.0 (GLOVE) IMPLANT
GLOVE BIOGEL PI IND STRL 8 (GLOVE) ×1 IMPLANT
GLOVE BIOGEL PI INDICATOR 7.0 (GLOVE)
GLOVE BIOGEL PI INDICATOR 8 (GLOVE) ×1
GOWN STRL REUS W/TWL 2XL LVL3 (GOWN DISPOSABLE) IMPLANT
GOWN STRL REUS W/TWL XL LVL3 (GOWN DISPOSABLE) ×2 IMPLANT
IRRIG SUCT STRYKERFLOW 2 WTIP (MISCELLANEOUS)
IRRIGATION SUCT STRKRFLW 2 WTP (MISCELLANEOUS) IMPLANT
KIT BASIN OR (CUSTOM PROCEDURE TRAY) ×2 IMPLANT
LEGGING LITHOTOMY PAIR STRL (DRAPES) IMPLANT
MANIFOLD NEPTUNE II (INSTRUMENTS) ×2 IMPLANT
PLATFORM TRANSANAL ACCESS 4X5 (MISCELLANEOUS) IMPLANT
PLATFORM TRANSANAL ACCESS 4X5. (MISCELLANEOUS)
RETRACTOR LONE STAR DISPOSABLE (INSTRUMENTS) IMPLANT
RETRACTOR STAY HOOK 5MM (MISCELLANEOUS) IMPLANT
SHEARS HARMONIC ACE PLUS 36CM (ENDOMECHANICALS) IMPLANT
SPONGE HEMORRHOID 8X3CM (HEMOSTASIS) ×2 IMPLANT
SURGILUBE 2OZ TUBE FLIPTOP (MISCELLANEOUS) ×2 IMPLANT
SUT V-LOC BARB 180 2/0GR6 GS22 (SUTURE)
SUT VIC AB 2-0 SH 27 (SUTURE)
SUT VIC AB 2-0 SH 27X BRD (SUTURE) IMPLANT
SUTURE V-LC BRB 180 2/0GR6GS22 (SUTURE) IMPLANT
TOWEL OR 17X26 10 PK STRL BLUE (TOWEL DISPOSABLE) ×2 IMPLANT
TOWEL OR NON WOVEN STRL DISP B (DISPOSABLE) ×2 IMPLANT
TRAY FOLEY MTR SLVR 16FR STAT (SET/KITS/TRAYS/PACK) IMPLANT
TRAY LAPAROSCOPIC (CUSTOM PROCEDURE TRAY) ×2 IMPLANT
TUBING CONNECTING 10 (TUBING) IMPLANT
TUBING INSUF HEATED (TUBING) ×2 IMPLANT
YANKAUER SUCT BULB TIP 10FT TU (MISCELLANEOUS) ×4 IMPLANT

## 2018-07-27 NOTE — Transfer of Care (Signed)
Immediate Anesthesia Transfer of Care Note  Patient: Linda Guzman  Procedure(s) Performed: FLEXIBLE SIGMOIDOSCOPY (N/A ) TRANSANAL EXCISON OF RECTAL POLYP (N/A )  Patient Location: PACU  Anesthesia Type:General  Level of Consciousness: drowsy  Airway & Oxygen Therapy: Patient Spontanous Breathing and Patient connected to face mask  Post-op Assessment: Report given to RN and Post -op Vital signs reviewed and stable  Post vital signs: Reviewed and stable  Last Vitals:  Vitals Value Taken Time  BP 124/71 07/27/2018 11:30 AM  Temp    Pulse 75 07/27/2018 11:31 AM  Resp 15 07/27/2018 11:31 AM  SpO2 100 % 07/27/2018 11:31 AM  Vitals shown include unvalidated device data.  Last Pain:  Vitals:   07/27/18 0834  TempSrc:   PainSc: 0-No pain         Complications: No apparent anesthesia complications

## 2018-07-27 NOTE — Anesthesia Postprocedure Evaluation (Signed)
Anesthesia Post Note  Patient: Linda Guzman  Procedure(s) Performed: FLEXIBLE SIGMOIDOSCOPY (N/A ) TRANSANAL EXCISON OF RECTAL POLYP (N/A )     Patient location during evaluation: PACU Anesthesia Type: General Level of consciousness: awake and alert Pain management: pain level controlled Vital Signs Assessment: post-procedure vital signs reviewed and stable Respiratory status: spontaneous breathing, nonlabored ventilation and respiratory function stable Cardiovascular status: blood pressure returned to baseline and stable Postop Assessment: no apparent nausea or vomiting Anesthetic complications: no    Last Vitals:  Vitals:   07/27/18 1337 07/27/18 1410  BP: (!) 142/86 132/74  Pulse:    Resp: 16 16  Temp: 36.5 C 36.4 C  SpO2: 96% 96%    Last Pain:  Vitals:   07/27/18 1410  TempSrc:   PainSc: 0-No pain                 Brennan Bailey

## 2018-07-27 NOTE — Discharge Instructions (Addendum)
ANORECTAL SURGERY: POST OP INSTRUCTIONS  1. DIET: Follow a light bland diet the first 24 hours after arrival home, such as soup, liquids, crackers, etc.  Be sure to include lots of fluids daily.  Avoid fast food or heavy meals as your are more likely to get nauseated.  Eat a low fat diet the next few days after surgery.    2. Take your usually prescribed home medications unless otherwise directed.  3. PAIN CONTROL: a. It is helpful to take an over-the-counter pain medication regularly for the first few days/weeks.  Choose from the following that works best for you: i. Ibuprofen (Advil, etc) Three 200mg  tabs every 6 hours as needed. ii. Acetaminophen (Tylenol, etc) 500-650mg  every 6 hours as needed iii. NOTE: You may take both of these medications together - most patients find it most helpful when alternating between the two (i.e. Ibuprofen at 6am, tylenol at 9am, ibuprofen at 12pm ...) b. A  prescription for pain medication may have been prescribed for you at discharge.  Take your pain medication as prescribed.  i. If you are having problems/concerns with the prescription medicine, please call us for further advice.  4. Avoid getting constipated.  Between the surgery and the pain medications, it is common to experience some constipation.  Increasing fluid intake (64oz of water per day) and taking a fiber supplement (such as Metamucil, Citrucel, FiberCon) 1-2 times a day regularly will usually help prevent this problem from occurring.  Take Miralax (over the counter) 1-2x/day while taking a narcotic pain medication. If no bowel movement after 48hours, you may additionally take a laxative like a bottle of Milk of Magnesia which can be purchased over the counter. Avoid enemas if possible as these are often painful.   5. Watch out for diarrhea.  If you have many loose bowel movements, simplify your diet to bland foods.  Stop any stool softeners and decrease your fiber supplement. If this worsens or does  not improve, please call us.  6. Wash / shower every day.  If you were discharged with a dressing, you may remove this the day after your surgery. You may shower normally, getting soap/water on your wound, particularly after bowel movements.  7. Soaking in a warm bath filled a couple inches ("Sitz bath") is a great way to clean the area after a bowel movement and many patients find it is a way to soothe the area.  8. ACTIVITIES as tolerated:   a. You may resume regular (light) daily activities beginning the next day--such as daily self-care, walking, climbing stairs--gradually increasing activities as tolerated.  If you can walk 30 minutes without difficulty, it is safe to try more intense activity such as jogging, treadmill, bicycling, low-impact aerobics, etc. b. Refrain from any heavy lifting or straining for the first 2 weeks after your procedure, particularly if your surgery was for hemorrhoids. c. Avoid activities that make your pain worse d. You may drive when you are no longer taking prescription pain medication, you can comfortably wear a seatbelt, and you can safely maneuver your car and apply brakes.  9. FOLLOW UP in our office a. Please call [facility] at [phone] to set up an appointment to see your surgeon in the office for a follow-up appointment approximately 2 weeks after your surgery. b. Make sure that you call for this appointment the day you arrive home to insure a convenient appointment time.  9. If you have disability or family leave forms that need to be completed,  you may have them completed by your primary care physician's office; for return to work instructions, please ask our office staff and they will be happy to assist you in obtaining this documentation   When to call us [phone]: 1. Poor pain control 2. Reactions / problems with new medications (rash/itching, etc)  3. Fever over 101.5 F (38.5 C) 4. Inability to urinate 5. Nausea/vomiting 6. Worsening  swelling or bruising 7. Continued bleeding from incision. 8. Increased pain, redness, or drainage from the incision  The clinic staff is available to answer your questions during regular business hours (8:30am-5pm).  Please don't hesitate to call and ask to speak to one of our nurses for clinical concerns.   A surgeon from [facility] is always on call at the hospitals   If you have a medical emergency, go to the nearest emergency room or call 911.   [facility] [address], [address], [place], [place]  [address] ? MAIN: [phone] FAX [phone] [email or web address] 

## 2018-07-27 NOTE — H&P (Signed)
H&P UPDATE  H&P dated 07/14/18 was reviewed by myself with the patient. She reports no interval changes in her health or history. She is ready for her procedure today.  Vitals:   07/27/18 0800 07/27/18 0834  BP: 133/87   Pulse: 72   Resp: 18   Temp: 98.6 F (37 C)   TempSrc: Oral   SpO2: 100%   Weight:  83.9 kg  Height:  5\' 8"  (1.727 m)   NAD, comfortable RRR Abdomen is soft, NT/ND  Linda Guzman is a very pleasant 6yoF with hx of HTN here today for surgery for her polypoid lesion in distal rectum - biopsies thus far benign and show hyperplastic polyp  MRI - no evidence of suspicious lymph nodes; no extension through muscularis propria   -The anatomy and physiology of the anal canal was discussed at length with the patient. The pathophysiology of rectal polyps and cancer was discussed at length as well.  -We discussed transanal minimally invasive surgery and possible transanal extraction. We discussed flexible sigmoidoscopy to identify location exactly for optimal positioning for her procedure -The planned procedure, material risks (including, but not limited to, pain, bleeding, infection, scarring, need for blood transfusion, damage to anal sphincter, incontinence of gas and/or stool, need for additional procedures, recurrence, pneumonia, heart attack, stroke, death) benefits and alternatives to surgery were discussed at length. The patient's questions were answered to her satisfaction, she voiced understanding and elected to proceed with surgery. Additionally, we discussed typical postoperative expectations and the recovery process.  Sharon Mt. Dema Severin, M.D. General and Colorectal Surgery [facility]

## 2018-07-27 NOTE — Anesthesia Procedure Notes (Signed)
Procedure Name: Intubation Date/Time: 07/27/2018 9:43 AM Performed by: Claudia Desanctis, CRNA Pre-anesthesia Checklist: Patient identified, Emergency Drugs available, Suction available and Patient being monitored Patient Re-evaluated:Patient Re-evaluated prior to induction Oxygen Delivery Method: Circle system utilized Preoxygenation: Pre-oxygenation with 100% oxygen Induction Type: IV induction Ventilation: Mask ventilation without difficulty Laryngoscope Size: 2 and Miller Grade View: Grade I Tube type: Oral Tube size: 7.0 mm Number of attempts: 1 Airway Equipment and Method: Stylet Placement Confirmation: ETT inserted through vocal cords under direct vision,  positive ETCO2,  breath sounds checked- equal and bilateral and CO2 detector Secured at: 21 cm Tube secured with: Tape Dental Injury: Teeth and Oropharynx as per pre-operative assessment

## 2018-07-27 NOTE — Op Note (Signed)
07/27/2018  11:23 AM  PATIENT:  Linda Guzman  62 y.o. female  Patient Care Team: Mikey College, NP as PCP - General (Nurse Practitioner)  PRE-OPERATIVE DIAGNOSIS:  Distal rectal polyp  POST-OPERATIVE DIAGNOSIS:  Same  PROCEDURE:   1. Transanal excision of rectal polyp 2. Flexible sigmoidoscopy  SURGEON:  Surgeon(s): Ileana Roup, MD  ASSISTANT: RNFA   ANESTHESIA:   general  SPECIMEN:  Distal rectal polyp  -On cork board - Zaccai Chavarin pin is proximal, blue is distal, black is left and red is right  DISPOSITION OF SPECIMEN:  PATHOLOGY - walked the specimen there myself and handed off to Pathology PA - Iona Beard.  COUNTS:  Sponge needle and instrument counts were reported correct x 2  PLAN OF CARE: Discharge to home after PACU  PATIENT DISPOSITION:  PACU - hemodynamically stable.  INDICATION: Linda Guzman is a very pleasant 62 year old female whom has a history of hypertension and underwent her first colonoscopy for screening purposes on 05/05/18 with Dr. Allen Norris. Findings notable for a 3 mm polyp in the sigmoid, descending, and transverse colon. These returned as a tubular adenoma. There was a 1.5 cm polyp found in the distal rectum which was biopsied and returned as a hyperplastic polyp-negative for dysplasia or malignancy. She denies any tissue prolapse per rectum or blood in her stool. She denies any issues with incontinence to flatus, liquid or solid stool.  She underwent rectal MRI 06/04/18 which demonstrated a subtle lesion in the anterior wall of the distal rectum just proximal to the anal verge that was measured to be 18 x 6 x 12 mm in size.  There is no extension to the muscularis propria noted.  There were no suspicious perirectal lymph nodes.  Options were discussed moving forward and she opted to pursue surgical removal of the rectal polyp.  We discussed flexible sigmoidoscopy and potential for transanal minimally invasive surgery versus transanal excision based on  intraoperative findings.  Please refer to H&P for details regarding this discussion  OR FINDINGS: A flexible sigmoidoscopy was performed to orient the location of the polyp for optimal positioning on the operating table.  The polyp was found to be in the right anterior distal rectum just above the dentate line.  Given this, a prone jackknife position was selected.  The polyp was too low for use of the [employer] gel point so a transanal excision was performed with gross 5 mm margins circumferentially.  The specimen was subsequently oriented on a cork board by myself with 4 different colored pins.  Maycol Hoying is proximal, blue is distal, black is left, red as right.  The defect was closed.  DESCRIPTION: The patient was identified in the preoperative holding area and taken to the OR where they were placed on the operating room table. SCDs were placed.  General endotracheal anesthesia was induced without difficulty. A surgical timeout was performed indicating the correct patient, procedure, positioning and need for preoperative antibiotics.  Prior to transferring her to the operating table, a flexible sigmoidoscopy was performed for purposes of localizing the exact location of the polyp to appropriately positioned the patient on the operating table.  The polyp was found to be in the distalmost aspect of the rectum and located primarily on the anterior wall of the rectum.  This is only visible via retroflexion of the scope.  The polyp was quite flat and soft.  For these reasons, orientation by digital exam was quite difficult. The scope was then removed and the patient  was transferred to the operating table and positioned in the prone jackknife position with the buttocks gently taped apart.  A second surgical timeout was performed indicating the correct patient, procedure, positioning and need for preoperative antibiotics.  A well lubricated Hill-Ferguson anoscope was placed and circumferential inspection of  the anal canal revealed a normal-appearing anal canal with small internal hemorrhoids.  The polyp was identified in the anterior portion of the rectum just to the right of midline.  This 5 mm margins were marked with electrocautery around the polyp and the polyp was subsequently injected/lifted with a mixture of 0.25% Marcaine with epinephrine.  The polyp was then grasped proximally with an Allis clamp and complete excision was performed extending down to the level of the anal sphincter muscle distally and into the subcutaneous tissue was proximal ureter.  Care was taken to stay well away from the vagina during this excision.  After completion of the polypectomy, the specimen was carefully oriented on a cork board using pins as noted above in findings.  Hemostasis was achieved with electrocautery.  The posterior wall vagina was palpated the polypectomy site examined and there were no defects in the vagina with a nice thick healthy plane of tissue remaining in the rectovaginal septum.  The defect was then closed transversely using 2-0 Vicryl suture.  Additional Exparel and Marcaine was then infiltrated as a perirectal block.  A piece of Proctofoam followed by 4 x 4's, ABD, and mesh underwear were used as a dressing.  The patient was taken off the operating table transferred to a stretcher, awakened from anesthesia, extubated, and transferred PACU in satisfactory condition.  I then walked the specimen myself to pathology - passed it off to pathology PA Marya Amsler.  DISPOSITION:PACU in satisfactory condition.

## 2018-07-28 ENCOUNTER — Encounter (HOSPITAL_COMMUNITY): Payer: Self-pay | Admitting: Surgery

## 2018-07-30 ENCOUNTER — Telehealth: Payer: Self-pay

## 2018-07-30 NOTE — Telephone Encounter (Signed)
Pt has been added to the recall list to contact us to schedule repeat colonoscopy in March 2020.

## 2018-07-30 NOTE — Telephone Encounter (Signed)
-----   Message from Lucilla Lame, MD sent at 07/29/2018  2:35 PM EST ----- Regarding: RE: Rectal polyp removed This patient likely needs a repeat look at the area that was treated in 3 months.  The patient should be set up for a colonoscopy in 3 months. ----- Message ----- From: Ileana Roup, MD Sent: 07/29/2018   1:16 PM EST To: Lucilla Lame, MD Subject: Rectal polyp removed                           Lillia Mountain - I hope all is going well. We got the polyp out of Ms. Allor's rectum - returned as a tubular adenoma, margins negative. She has done well. She initially delayed scheduling her procedure as she was going back to [place]. Will you be able to do her surveillance (potentially at 20yr?). Thanks for sending her my way.  Gerald Stabs

## 2018-08-13 ENCOUNTER — Other Ambulatory Visit: Payer: Self-pay | Admitting: Nurse Practitioner

## 2018-08-13 DIAGNOSIS — I10 Essential (primary) hypertension: Secondary | ICD-10-CM

## 2018-08-13 NOTE — Telephone Encounter (Signed)
Patient has 3 refill send with 90 days supply advised to call pharmacy.

## 2018-08-13 NOTE — Telephone Encounter (Signed)
Pt  Called requesting amlodipine 10 mg , Hydrochlorothiazide 50 mg   Called into [facility]  [place]

## 2018-09-07 ENCOUNTER — Ambulatory Visit
Admission: RE | Admit: 2018-09-07 | Discharge: 2018-09-07 | Disposition: A | Discharge status: Home / Self Care | Payer: BLUE CROSS/BLUE SHIELD | Source: Ambulatory Visit | Attending: Nurse Practitioner | Admitting: Nurse Practitioner

## 2018-09-07 ENCOUNTER — Encounter: Payer: Self-pay | Admitting: Family Medicine

## 2018-09-07 DIAGNOSIS — Z13820 Encounter for screening for osteoporosis: Secondary | ICD-10-CM

## 2018-09-07 DIAGNOSIS — Z0000 Encounter for general adult medical examination without abnormal findings: Secondary | ICD-10-CM

## 2018-09-07 DIAGNOSIS — M85852 Other specified disorders of bone density and structure, left thigh: Secondary | ICD-10-CM | POA: Insufficient documentation

## 2018-09-07 DIAGNOSIS — Z1239 Encounter for other screening for malignant neoplasm of breast: Secondary | ICD-10-CM

## 2018-09-08 ENCOUNTER — Other Ambulatory Visit: Payer: Self-pay | Admitting: Nurse Practitioner

## 2018-09-08 DIAGNOSIS — R928 Other abnormal and inconclusive findings on diagnostic imaging of breast: Secondary | ICD-10-CM

## 2018-09-09 ENCOUNTER — Telehealth: Payer: Self-pay | Admitting: Nurse Practitioner

## 2018-09-09 NOTE — Telephone Encounter (Signed)
Reviewed patient's request: will start OTC calcium and vitamin D.  Can address prescription at next appointment.

## 2018-09-09 NOTE — Telephone Encounter (Signed)
Pt. Husband called said that he was told that they could get calcium OTC Mr. Blenda Nicely said that they want a prescription  to be called in

## 2018-09-11 ENCOUNTER — Ambulatory Visit: Admission: RE | Admit: 2018-09-11 | Payer: BLUE CROSS/BLUE SHIELD | Source: Ambulatory Visit

## 2018-09-11 ENCOUNTER — Ambulatory Visit
Admission: RE | Admit: 2018-09-11 | Discharge: 2018-09-11 | Disposition: A | Discharge status: Home / Self Care | Payer: BLUE CROSS/BLUE SHIELD | Source: Ambulatory Visit | Attending: Nurse Practitioner | Admitting: Nurse Practitioner

## 2018-09-11 ENCOUNTER — Other Ambulatory Visit: Payer: Self-pay | Admitting: Nurse Practitioner

## 2018-09-11 DIAGNOSIS — R928 Other abnormal and inconclusive findings on diagnostic imaging of breast: Secondary | ICD-10-CM

## 2018-10-01 ENCOUNTER — Telehealth: Payer: Self-pay | Admitting: Gastroenterology

## 2018-10-01 NOTE — Telephone Encounter (Signed)
Oti (spouse)called & L/M on V/M stating he had received letter to schedule a colonoscopy for his wife.

## 2018-10-05 ENCOUNTER — Other Ambulatory Visit: Payer: Self-pay

## 2018-10-05 DIAGNOSIS — Z86010 Personal history of colonic polyps: Secondary | ICD-10-CM

## 2018-10-05 NOTE — Telephone Encounter (Signed)
Patient has been scheduled her repeat colonoscopy with Dr. Allen Norris for 10/27/18 at [facility].  Scheduled with her husband Oti.  Thanks [employer]

## 2018-10-13 ENCOUNTER — Ambulatory Visit: Payer: BLUE CROSS/BLUE SHIELD | Admitting: Nurse Practitioner

## 2018-10-21 IMAGING — MR MR PELVIS WO/W CM
5 of 8 series · 26 of 48 positions shown · IV contrast (multihance)
Comparison: Colonoscopy report and images have been reviewed.

CLINICAL DATA: 15 mm sessile polyp in the rectum.

EXAM:
MRI PELVIS WITHOUT AND WITH CONTRAST
TECHNIQUE: Multiplanar multisequence MR imaging of the pelvis was performed
both before and after administration of intravenous contrast. Small
amount of US gel was administered per rectum to optimize tumor
evaluation.
CONTRAST:  17mL MULTIHANCE GADOBENATE DIMEGLUMINE 529 MG/ML IV SOLN

[Series 2: t2_tse_sag · sagittal · 3.0mm · 0.47mm/px · 5 of 44 slices shown]
[im 1/44]
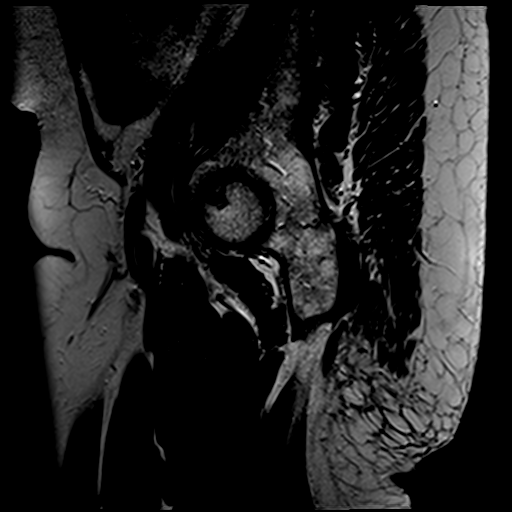
[im 11/44]
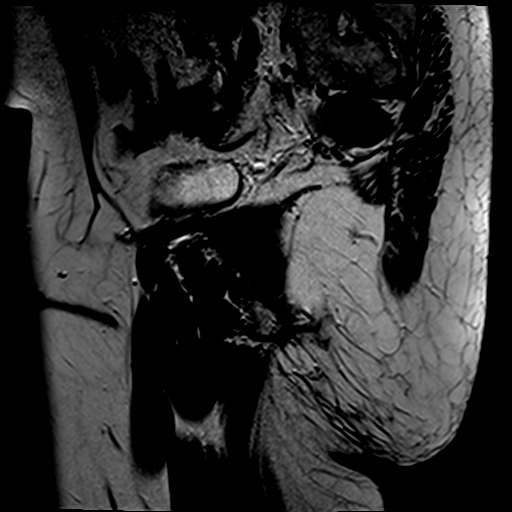
[im 22/44]
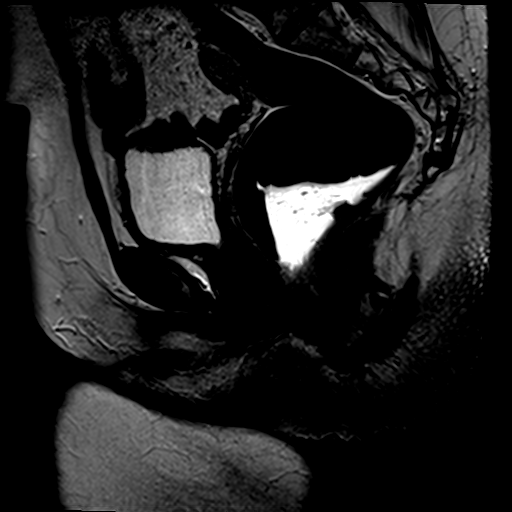
[im 33/44]
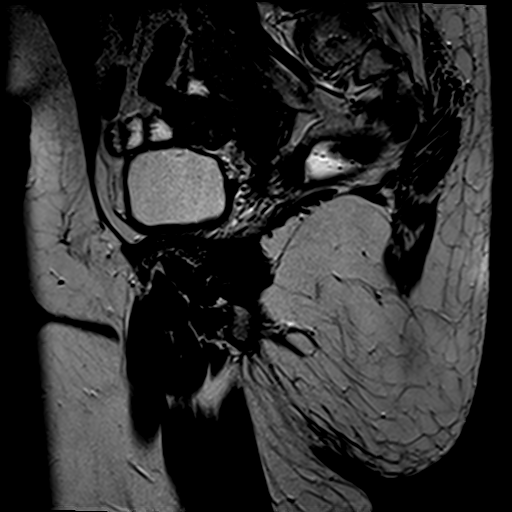
[im 44/44]
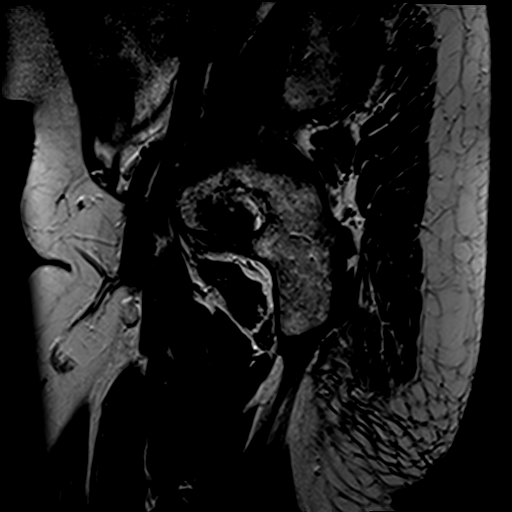

[Series 3: T2 · axial · 3.0mm · 0.47mm/px · z∈[-153,-22]mm · 7 of 54 slices shown (1 of 2)]
[im 1/54]
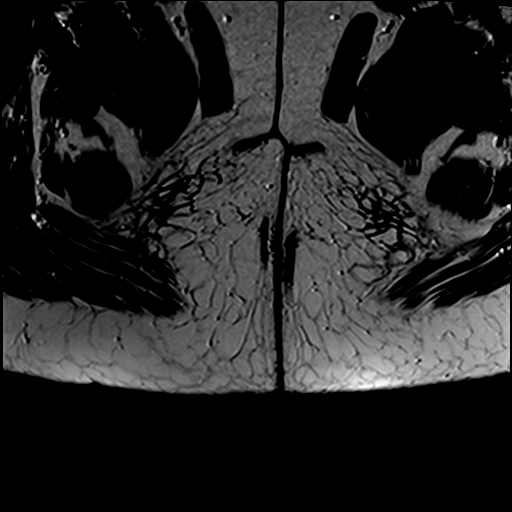
[im 9/54]
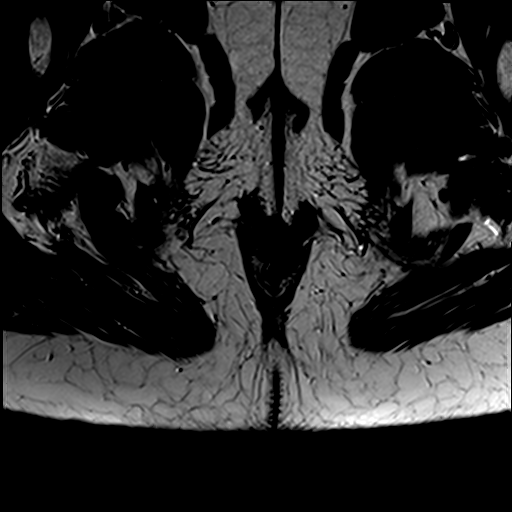
[im 18/54]
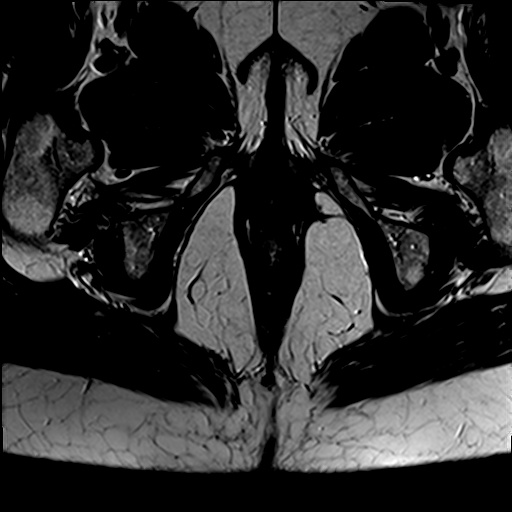
[im 27/54]
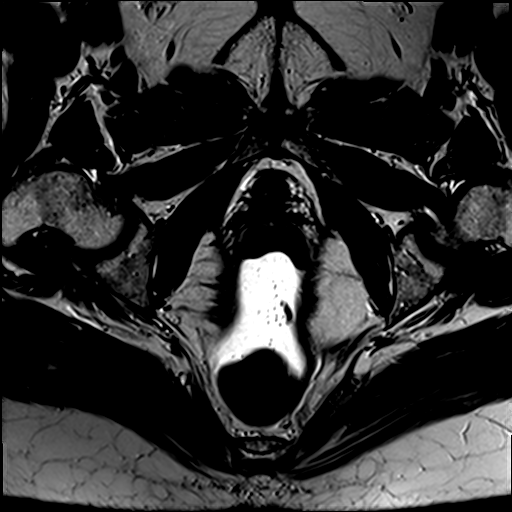
[im 36/54]
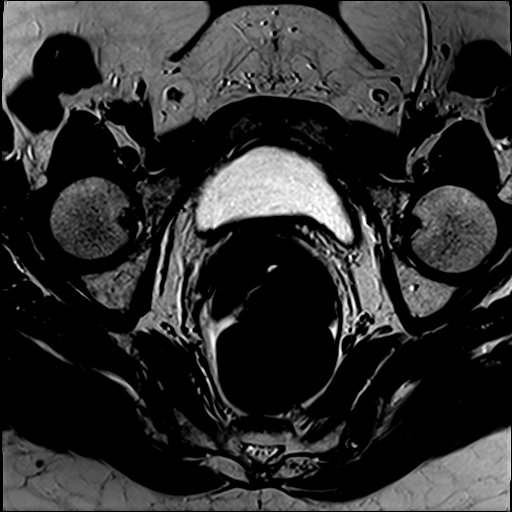
[im 45/54]
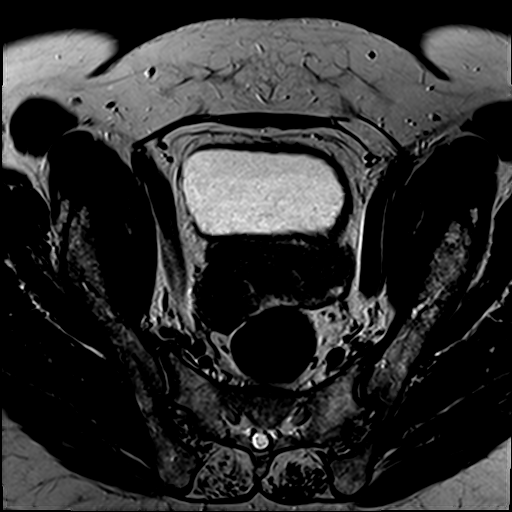
[im 54/54]
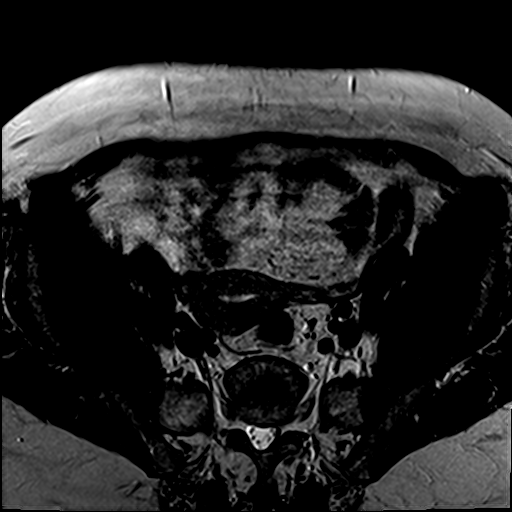

[Series 5: T1 fat-sat · axial · 5.0mm · 1.12mm/px · z∈[-105,+76]mm · 5 of 34 slices shown]
[im 1/34]
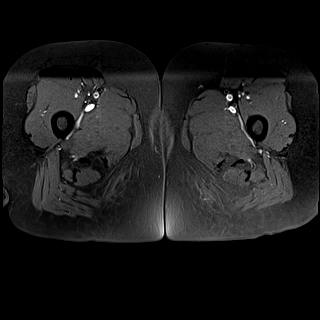
[im 9/34]
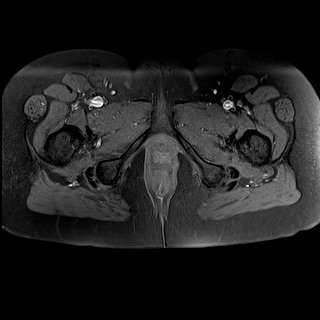
[im 17/34]
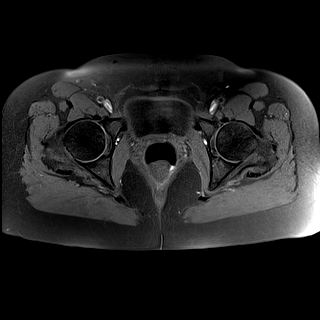
[im 25/34]
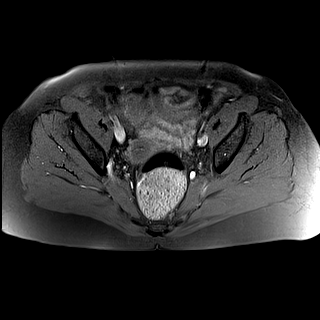
[im 34/34]
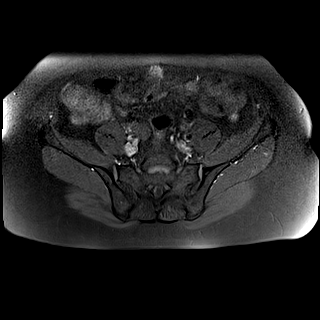

[Series 6: T2 fat-sat · axial · 5.0mm · 1.12mm/px · z∈[-105,+76]mm · 5 of 34 slices shown]
[im 1/34]
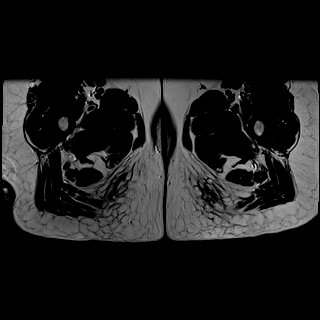
[im 9/34]
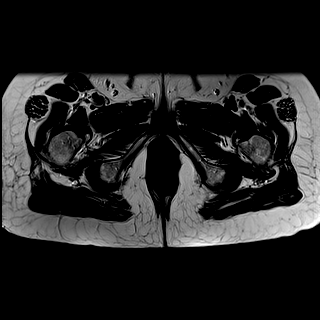
[im 17/34]
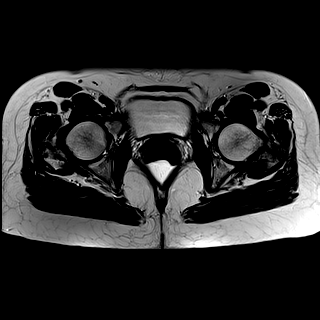
[im 25/34]
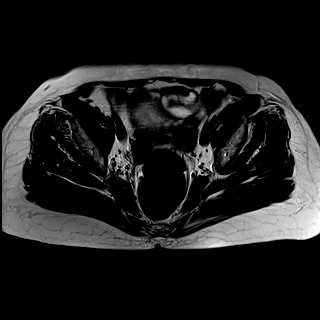
[im 34/34]
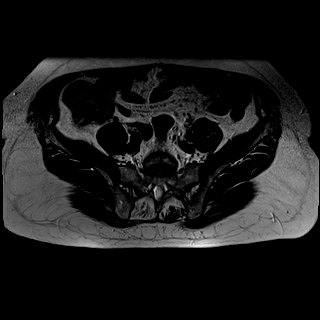

[Series 7: T2 · oblique · 3.0mm · 0.65mm/px · 4 of 54 slices shown (2 of 2)]
[im 1/54]
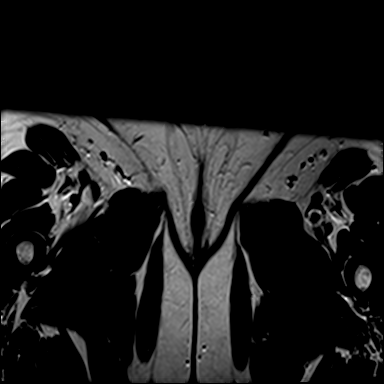
[im 9/54]
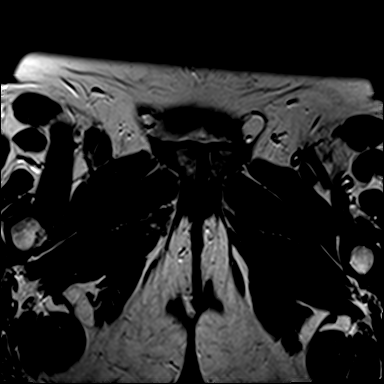
[im 18/54]
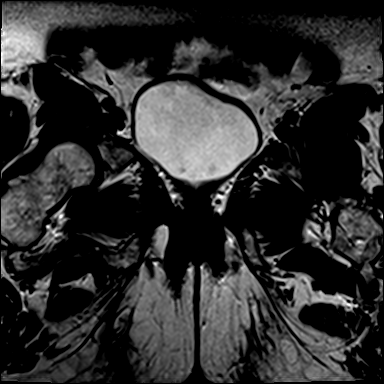
[im 27/54]
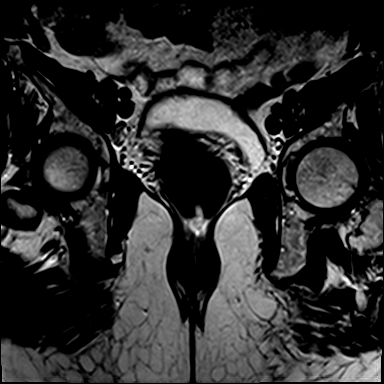

[26 of 48 positions shown; findings below may reference images not displayed]

This
study was discussed with Dr. Josiane Jojo, from surgery, prior to
interpretation..
FINDINGS: TUMOR LOCATION

Of note, this is a relatively small lesion. On colonoscopy, the
polyp an undulating almost plaque-like sessile appearance. The
lesion was not oriented on the colonoscopy report but appears to be
positioned just proximal to the anal verge. On MRI today, the lesion
is not markedly different signal intensity than the background
submucosa making localization/orientation suboptimal, but the lesion
appears to be on the anterior rectal wall (approximately [DATE] to
[DATE]).

Location from Anal Verge:  Low, 0-5 cm.

Shortest Distance from Tumor to Anal Sphincter:  0-1 cm

TUMOR DESCRIPTION

Circumferential Extent: Subtle lesion anterior wall distal rectum,
just proximal to the anal verge measures 18 x 6 x 12 mm. This is
demonstrated by subtle differential signal intensity compared to
background submucosa on T2 imaging (image 34/series 3).

Craniocaudal Extent:  1.7 cm

T - CATEGORY

Extension through Muscularis Propria: No = T1 or T2

Maximum extension beyond Muscularis Propria:  0 mm

(early T3 = <5mm; advanced T3 = >5mm)

Extramural vascular invasion/tumor thrombus:  No

Shortest distance of any tumor/node from Mesorectal Fascia: N/A

Invasion of Anterior Peritoneal Reflection:  No

Involvement of Adjacent Organs or Pelvic Sidewall Structures:  No

N - CATEGORY

Mesorectal Lymph Nodes >=5mm:  No = N0

Extra-mesorectal Lymphadenopathy: No = N0

Other: 6 mm short axis low right pelvic sidewall lymph node evident
([DATE])
IMPRESSION: Rectal lesion T stage: T1 or T2

Rectal lesion N stage:  N0

Distance from tumor to the anal sphincter is 0-1 cm.

## 2018-10-26 ENCOUNTER — Encounter: Payer: Self-pay | Admitting: *Deleted

## 2018-10-27 ENCOUNTER — Encounter
Admission: RE | Disposition: A | Discharge status: Home / Self Care | Payer: Self-pay | Source: Home / Self Care | Attending: Gastroenterology

## 2018-10-27 ENCOUNTER — Ambulatory Visit: Payer: BLUE CROSS/BLUE SHIELD | Admitting: Anesthesiology

## 2018-10-27 ENCOUNTER — Ambulatory Visit
Admission: RE | Admit: 2018-10-27 | Discharge: 2018-10-27 | Disposition: A | Discharge status: Home / Self Care | Payer: BLUE CROSS/BLUE SHIELD | Attending: Gastroenterology | Admitting: Gastroenterology

## 2018-10-27 ENCOUNTER — Encounter: Payer: Self-pay | Admitting: Anesthesiology

## 2018-10-27 ENCOUNTER — Other Ambulatory Visit: Payer: Self-pay

## 2018-10-27 DIAGNOSIS — Z8249 Family history of ischemic heart disease and other diseases of the circulatory system: Secondary | ICD-10-CM | POA: Diagnosis not present

## 2018-10-27 DIAGNOSIS — Z823 Family history of stroke: Secondary | ICD-10-CM | POA: Diagnosis not present

## 2018-10-27 DIAGNOSIS — D122 Benign neoplasm of ascending colon: Secondary | ICD-10-CM | POA: Insufficient documentation

## 2018-10-27 DIAGNOSIS — D123 Benign neoplasm of transverse colon: Secondary | ICD-10-CM | POA: Diagnosis not present

## 2018-10-27 DIAGNOSIS — Z888 Allergy status to other drugs, medicaments and biological substances status: Secondary | ICD-10-CM | POA: Diagnosis not present

## 2018-10-27 DIAGNOSIS — Z79899 Other long term (current) drug therapy: Secondary | ICD-10-CM | POA: Insufficient documentation

## 2018-10-27 DIAGNOSIS — Z860100 Personal history of colon polyps, unspecified: Secondary | ICD-10-CM

## 2018-10-27 DIAGNOSIS — K621 Rectal polyp: Secondary | ICD-10-CM | POA: Diagnosis not present

## 2018-10-27 DIAGNOSIS — Z1211 Encounter for screening for malignant neoplasm of colon: Secondary | ICD-10-CM | POA: Diagnosis not present

## 2018-10-27 DIAGNOSIS — I10 Essential (primary) hypertension: Secondary | ICD-10-CM | POA: Diagnosis not present

## 2018-10-27 HISTORY — PX: COLONOSCOPY WITH PROPOFOL: SHX5780

## 2018-10-27 SURGERY — COLONOSCOPY WITH PROPOFOL
Anesthesia: General

## 2018-10-27 MED ORDER — PROPOFOL 500 MG/50ML IV EMUL
INTRAVENOUS | Status: DC | PRN
  Administered 2018-10-27: 100 ug/kg/min via INTRAVENOUS

## 2018-10-27 MED ORDER — PROPOFOL 10 MG/ML IV BOLUS
INTRAVENOUS | Status: AC
  Filled 2018-10-27: qty 80

## 2018-10-27 MED ORDER — PROPOFOL 10 MG/ML IV BOLUS
INTRAVENOUS | Status: DC | PRN
  Administered 2018-10-27: 50 mg via INTRAVENOUS

## 2018-10-27 MED ORDER — SODIUM CHLORIDE 0.9 % IV SOLN
INTRAVENOUS | Status: DC
  Administered 2018-10-27: 1000 mL via INTRAVENOUS

## 2018-10-27 MED ORDER — LIDOCAINE HCL (CARDIAC) PF 100 MG/5ML IV SOSY
PREFILLED_SYRINGE | INTRAVENOUS | Status: DC | PRN
  Administered 2018-10-27: 100 mg via INTRAVENOUS

## 2018-10-27 MED ORDER — LIDOCAINE HCL (PF) 2 % IJ SOLN
INTRAMUSCULAR | Status: AC
  Filled 2018-10-27: qty 10

## 2018-10-27 NOTE — Anesthesia Preprocedure Evaluation (Signed)
Anesthesia Evaluation  Patient identified by MRN, date of birth, ID band Patient awake    Reviewed: Allergy & Precautions, NPO status , Patient's Chart, lab work & pertinent test results, reviewed documented beta blocker date and time   Airway Mallampati: II  TM Distance: >3 FB     Dental  (+) Chipped   Pulmonary           Cardiovascular hypertension, Pt. on medications      Neuro/Psych    GI/Hepatic   Endo/Other    Renal/GU      Musculoskeletal   Abdominal   Peds  Hematology   Anesthesia Other Findings EKG ok.  Reproductive/Obstetrics                             Anesthesia Physical Anesthesia Plan  ASA: II  Anesthesia Plan: General   Post-op Pain Management:    Induction: Intravenous  PONV Risk Score and Plan:   Airway Management Planned:   Additional Equipment:   Intra-op Plan:   Post-operative Plan:   Informed Consent: I have reviewed the patients History and Physical, chart, labs and discussed the procedure including the risks, benefits and alternatives for the proposed anesthesia with the patient or authorized representative who has indicated his/her understanding and acceptance.       Plan Discussed with: CRNA  Anesthesia Plan Comments:         Anesthesia Quick Evaluation

## 2018-10-27 NOTE — Transfer of Care (Signed)
Immediate Anesthesia Transfer of Care Note  Patient: Linda Guzman  Procedure(s) Performed: COLONOSCOPY WITH PROPOFOL (N/A )  Patient Location: PACU and Endoscopy Unit  Anesthesia Type:General  Level of Consciousness: awake, drowsy and patient cooperative  Airway & Oxygen Therapy: Patient Spontanous Breathing  Post-op Assessment: Report given to RN, Post -op Vital signs reviewed and stable and Patient moving all extremities  Post vital signs: Reviewed and stable  Last Vitals:  Vitals Value Taken Time  BP 101/75 10/27/2018 10:39 AM  Temp 36.1 C 10/27/2018 10:30 AM  Pulse 73 10/27/2018 10:40 AM  Resp 19 10/27/2018 10:40 AM  SpO2 98 % 10/27/2018 10:40 AM  Vitals shown include unvalidated device data.  Last Pain:  Vitals:   10/27/18 1030  TempSrc: Tympanic  PainSc:          Complications: No apparent anesthesia complications

## 2018-10-27 NOTE — H&P (Signed)
Lucilla Lame, MD [facility] [address], [place], [place] [address] [phone] Fax : [phone]  Primary Care Physician:  Mikey College, NP Primary Gastroenterologist:  Dr. Allen Norris  Pre-Procedure History & Physical: HPI:  Linda Guzman is a 63 y.o. female is here for an colonoscopy.   Past Medical History:  Diagnosis Date  . Hypertension     Past Surgical History:  Procedure Laterality Date  . COLONOSCOPY WITH PROPOFOL N/A 05/05/2018   Procedure: COLONOSCOPY WITH PROPOFOL;  Surgeon: Lucilla Lame, MD;  Location: [facility] ENDOSCOPY;  Service: Endoscopy;  Laterality: N/A;  . FLEXIBLE SIGMOIDOSCOPY N/A 07/27/2018   Procedure: FLEXIBLE SIGMOIDOSCOPY;  Surgeon: Ileana Roup, MD;  Location: [facility] ORS;  Service: General;  Laterality: N/A;  . NO PAST SURGERIES    . TRANSANAL EXCISION OF RECTAL MASS N/A 07/27/2018   Procedure: TRANSANAL EXCISON OF RECTAL POLYP;  Surgeon: Ileana Roup, MD;  Location: [facility] ORS;  Service: General;  Laterality: N/A;    Prior to Admission medications   Medication Sig Start Date End Date Taking? Authorizing Provider  amLODipine (NORVASC) 10 MG tablet Take 1 tablet (10 mg total) by mouth daily. 04/24/18  Yes Mikey College, NP  cholecalciferol (VITAMIN D) 1000 units tablet Take 1,000 Units by mouth daily.   Yes [provider]  hydrochlorothiazide (HYDRODIURIL) 50 MG tablet Take 1 tablet (50 mg total) by mouth daily. 04/24/18  Yes Mikey College, NP  Magnesium Oxide 250 MG TABS Take 250 mg by mouth daily.    Yes [provider]  traMADol (ULTRAM) 50 MG tablet Take 1 tablet (50 mg total) by mouth every 6 (six) hours as needed (postop pain not controlled with tylenol/ibuprofen). 07/27/18  Yes Ileana Roup, MD    Allergies as of 10/05/2018 - Review Complete 07/27/2018  Allergen Reaction Noted  . Chloroquine Itching 12/11/2016    Family History  Problem Relation Age of Onset  . Hypertension Mother   .  Stroke Sister   . Breast cancer Neg Hx     Social History   Socioeconomic History  . Marital status: Married    Spouse name: Not on file  . Number of children: Not on file  . Years of education: Not on file  . Highest education level: Not on file  Occupational History  . Not on file  Social Needs  . Financial resource strain: Not on file  . Food insecurity:    Worry: Not on file    Inability: Not on file  . Transportation needs:    Medical: Not on file    Non-medical: Not on file  Tobacco Use  . Smoking status: Never Smoker  . Smokeless tobacco: Never Used  Substance and Sexual Activity  . Alcohol use: No  . Drug use: No  . Sexual activity: Yes    Birth control/protection: Post-menopausal  Lifestyle  . Physical activity:    Days per week: Not on file    Minutes per session: Not on file  . Stress: Not on file  Relationships  . Social connections:    Talks on phone: Not on file    Gets together: Not on file    Attends religious service: Not on file    Active member of club or organization: Not on file    Attends meetings of clubs or organizations: Not on file    Relationship status: Not on file  . Intimate partner violence:    Fear of current or ex partner: Not  on file    Emotionally abused: Not on file    Physically abused: Not on file    Forced sexual activity: Not on file  Other Topics Concern  . Not on file  Social History Narrative  . Not on file    Review of Systems: See HPI, otherwise negative ROS  Physical Exam: BP (!) 140/97   Pulse 81   Temp (!) 96.7 F (35.9 C) (Tympanic)   Resp 16   Ht 5\' 8"  (1.727 m)   Wt 83.9 kg   SpO2 100%   BMI 28.13 kg/m  General:   Alert,  pleasant and cooperative in NAD Head:  Normocephalic and atraumatic. Neck:  Supple; no masses or thyromegaly. Lungs:  Clear throughout to auscultation.    Heart:  Regular rate and rhythm. Abdomen:  Soft, nontender and nondistended. Normal bowel sounds, without guarding, and  without rebound.   Neurologic:  Alert and  oriented x4;  grossly normal neurologically.  Impression/Plan: Linda Guzman is here for an colonoscopy to be performed for history of colon polyps  Risks, benefits, limitations, and alternatives regarding  colonoscopy have been reviewed with the patient.  Questions have been answered.  All parties agreeable.   Lucilla Lame, MD  10/27/2018, 10:09 AM

## 2018-10-27 NOTE — Anesthesia Post-op Follow-up Note (Signed)
Anesthesia QCDR form completed.        

## 2018-10-27 NOTE — Anesthesia Postprocedure Evaluation (Signed)
Anesthesia Post Note  Patient: Linda Guzman  Procedure(s) Performed: COLONOSCOPY WITH PROPOFOL (N/A )  Patient location during evaluation: Endoscopy Anesthesia Type: General Level of consciousness: awake and alert Pain management: pain level controlled Vital Signs Assessment: post-procedure vital signs reviewed and stable Respiratory status: spontaneous breathing, nonlabored ventilation, respiratory function stable and patient connected to nasal cannula oxygen Cardiovascular status: blood pressure returned to baseline and stable Postop Assessment: no apparent nausea or vomiting Anesthetic complications: no     Last Vitals:  Vitals:   10/27/18 1050 10/27/18 1100  BP: 102/86 119/85  Pulse: 76 73  Resp: 17 14  Temp:    SpO2: 99% 100%    Last Pain:  Vitals:   10/27/18 1030  TempSrc: Tympanic  PainSc:                  Kacelyn Rowzee S

## 2018-10-27 NOTE — Op Note (Signed)
[facility] Gastroenterology Patient Name: Linda Guzman Procedure Date: 10/27/2018 10:09 AM MRN: [id number] Account #: [id number] Date of Birth: March 12, 1956 Admit Type: Outpatient Age: 63 Room: [facility] ENDO ROOM 4 Gender: Female Note Status: Finalized Procedure:            Colonoscopy Indications:          High risk colon cancer surveillance: Personal history                        of colonic polyps Providers:            Lucilla Lame MD, MD Medicines:            Propofol per Anesthesia Complications:        No immediate complications. Procedure:            Pre-Anesthesia Assessment:                       - Prior to the procedure, a History and Physical was                        performed, and patient medications and allergies were                        reviewed. The patient's tolerance of previous                        anesthesia was also reviewed. The risks and benefits of                        the procedure and the sedation options and risks were                        discussed with the patient. All questions were                        answered, and informed consent was obtained. Prior                        Anticoagulants: The patient has taken no previous                        anticoagulant or antiplatelet agents. ASA Grade                        Assessment: II - A patient with mild systemic disease.                        After reviewing the risks and benefits, the patient was                        deemed in satisfactory condition to undergo the                        procedure.                       After obtaining informed consent, the colonoscope was                        passed under direct vision. Throughout the  procedure,                        the patient's blood pressure, pulse, and oxygen                        saturations were monitored continuously. The                        Colonoscope was introduced through the anus and    advanced to the the cecum, identified by appendiceal                        orifice and ileocecal valve. The colonoscopy was                        performed without difficulty. The patient tolerated the                        procedure well. The quality of the bowel preparation                        was excellent. Findings:      The perianal and digital rectal examinations were normal.      Two sessile polyps were found in the ascending colon. The polyps were 1       to 2 mm in size. These polyps were removed with a cold biopsy forceps.       Resection and retrieval were complete.      Two sessile polyps were found in the transverse colon. The polyps were 2       to 3 mm in size. These polyps were removed with a cold biopsy forceps.       Resection and retrieval were complete.      A 4 mm polyp was found in the rectum. The polyp was sessile. The polyp       was removed with a cold snare. Resection and retrieval were complete.      The rectal polyp was at the site of the previous resection. Impression:           - Two 1 to 2 mm polyps in the ascending colon, removed                        with a cold biopsy forceps. Resected and retrieved.                       - Two 2 to 3 mm polyps in the transverse colon, removed                        with a cold biopsy forceps. Resected and retrieved.                       - One 4 mm polyp in the rectum, removed with a cold                        snare. Resected and retrieved.                       - The rectal polyp was at the site of the previous  resection. Recommendation:       - Discharge patient to home.                       - Resume previous diet.                       - Continue present medications.                       - Await pathology results.                       - Repeat colonoscopy in 3 years for surveillance. Procedure Code(s):    --- Professional ---                       [phone], Colonoscopy, flexible; with  removal of tumor(s),                        polyp(s), or other lesion(s) by snare technique                       45380, 29, Colonoscopy, flexible; with biopsy, single                        or multiple Diagnosis Code(s):    --- Professional ---                       Z86.010, Personal history of colonic polyps                       D12.2, Benign neoplasm of ascending colon                       D12.3, Benign neoplasm of transverse colon (hepatic                        flexure or splenic flexure)                       K62.1, Rectal polyp CPT copyright 2018 American Medical Association. All rights reserved. The codes documented in this report are preliminary and upon coder review may  be revised to meet current compliance requirements. Lucilla Lame MD, MD 10/27/2018 10:35:31 AM This report has been signed electronically. Number of Addenda: 0 Note Initiated On: 10/27/2018 10:09 AM Scope Withdrawal Time: 0 hours 8 minutes 0 seconds  Total Procedure Duration: 0 hours 12 minutes 5 seconds       [facility]

## 2018-10-28 ENCOUNTER — Encounter: Payer: Self-pay | Admitting: Gastroenterology

## 2018-10-28 LAB — SURGICAL PATHOLOGY

## 2018-11-02 ENCOUNTER — Ambulatory Visit (INDEPENDENT_AMBULATORY_CARE_PROVIDER_SITE_OTHER): Payer: Self-pay | Admitting: Nurse Practitioner

## 2018-11-02 ENCOUNTER — Other Ambulatory Visit: Payer: Self-pay

## 2018-11-02 VITALS — BP 120/88 | HR 79 | Resp 15 | Ht 68.0 in | Wt 186.2 lb

## 2018-11-02 DIAGNOSIS — R739 Hyperglycemia, unspecified: Secondary | ICD-10-CM

## 2018-11-02 DIAGNOSIS — T4145XA Adverse effect of unspecified anesthetic, initial encounter: Secondary | ICD-10-CM

## 2018-11-02 DIAGNOSIS — T8859XA Other complications of anesthesia, initial encounter: Secondary | ICD-10-CM

## 2018-11-02 DIAGNOSIS — I10 Essential (primary) hypertension: Secondary | ICD-10-CM

## 2018-11-02 LAB — COMPLETE METABOLIC PANEL WITH GFR
AG Ratio: 1.4 (calc) (ref 1.0–2.5)
ALT: 28 U/L (ref 6–29)
AST: 31 U/L (ref 10–35)
Albumin: 4.5 g/dL (ref 3.6–5.1)
Alkaline phosphatase (APISO): 50 U/L (ref 37–153)
BUN: 10 mg/dL (ref 7–25)
CO2: 28 mmol/L (ref 20–32)
Calcium: 10.2 mg/dL (ref 8.6–10.4)
Chloride: 101 mmol/L (ref 98–110)
Creat: 0.95 mg/dL (ref 0.50–0.99)
GFR, Est African American: 74 mL/min/{1.73_m2} (ref >=60)
GFR, Est Non African American: 64 mL/min/{1.73_m2} (ref >=60)
Globulin: 3.2 g/dL (calc) (ref 1.9–3.7)
Glucose, Bld: 111 mg/dL — ABNORMAL HIGH (ref 65–99)
Potassium: 3.9 mmol/L (ref 3.5–5.3)
Sodium: 142 mmol/L (ref 135–146)
Total Bilirubin: 0.5 mg/dL (ref 0.2–1.2)
Total Protein: 7.7 g/dL (ref 6.1–8.1)

## 2018-11-02 MED ORDER — AMLODIPINE BESYLATE 10 MG PO TABS: 10.0000 mg | ORAL_TABLET | Freq: Every day | ORAL | 3 refills | Status: DC

## 2018-11-02 MED ORDER — HYDROCHLOROTHIAZIDE 50 MG PO TABS: 50.0000 mg | ORAL_TABLET | Freq: Every day | ORAL | 3 refills | Status: DC

## 2018-11-02 NOTE — Progress Notes (Signed)
Subjective:    Patient ID: Linda Guzman, female    DOB: 1956/03/02, 63 y.o.   MRN: [id number]  Garlene Apperson is a 63 y.o. female presenting on 11/02/2018 for Hypertension   HPI Hypertension - She is not checking BP at home or outside of clinic.    - Current medications: amlodipine 10 mg once daily, hydrochlorothiazide 50 mg once daily, tolerating well without side effects - She is not currently symptomatic. - Pt denies headache, lightheadedness, dizziness, changes in vision, chest tightness/pressure, palpitations, leg swelling, sudden loss of speech or loss of consciousness. - She  reports an exercise routine that includes walking/caregiving for a baby, 4-5 days per week. - Her diet is low in salt, low in fat, and low in carbohydrates.   Elevated Glucose  Last elevated glucose has not had repeat A1c or repeat glucose.  Patient reports continuation of improved lifestyle changes and is now at stable weight.   - She is not currently symptomatic and denies polydipsia, polyphagia, polyuria, headaches, diaphoresis, shakiness, chills, pain, numbness or tingling in extremities and changes in vision.    Recent Labs    04/09/18 0927 07/21/18 1157  HGBA1C 6.0* 5.8*     Anesthesia related problem: Patient has had weakness, tiredness, fatigue since last colonoscopy. Review of procedure shows use of propofol.  In past, was likely sedated with midazolam/fentanyl.  Noted in allergies as intolerance.  Social History   Tobacco Use  . Smoking status: Never Smoker  . Smokeless tobacco: Never Used  Substance Use Topics  . Alcohol use: No  . Drug use: No    Review of Systems Per HPI unless specifically indicated above     Objective:    BP 120/88 (BP Location: Left Arm, Patient Position: Sitting, Cuff Size: Large)   Pulse 79   Resp 15   Ht 5\' 8"  (1.727 m)   Wt 186 lb 3.2 oz (84.5 kg)   SpO2 99%   BMI 28.31 kg/m   Wt Readings from Last 3 Encounters:  11/02/18 186 lb 3.2 oz (84.5 kg)   10/27/18 185 lb (83.9 kg)  07/27/18 185 lb (83.9 kg)    Physical Exam Vitals signs reviewed.  Constitutional:      General: She is not in acute distress.    Appearance: She is well-developed.  HENT:     Head: Normocephalic and atraumatic.  Cardiovascular:     Rate and Rhythm: Normal rate and regular rhythm.     Pulses:          Radial pulses are 2+ on the right side and 2+ on the left side.       Posterior tibial pulses are 1+ on the right side and 1+ on the left side.     Heart sounds: Normal heart sounds, S1 normal and S2 normal.  Pulmonary:     Effort: Pulmonary effort is normal. No respiratory distress.     Breath sounds: Normal breath sounds and air entry.  Abdominal:     General: Bowel sounds are normal. There is no distension.     Palpations: Abdomen is soft.     Tenderness: There is no abdominal tenderness.     Hernia: No hernia is present.  Musculoskeletal:     Right lower leg: No edema.     Left lower leg: No edema.  Skin:    General: Skin is warm and dry.     Capillary Refill: Capillary refill takes less than 2 seconds.  Neurological:  Mental Status: She is alert and oriented to person, place, and time.  Psychiatric:        Attention and Perception: Attention normal.        Mood and Affect: Mood and affect normal.        Behavior: Behavior normal. Behavior is cooperative.     Results for orders placed or performed in visit on 11/02/18  COMPLETE METABOLIC PANEL WITH GFR  Result Value Ref Range   Glucose, Bld 111 (H) 65 - 99 mg/dL   BUN 10 7 - 25 mg/dL   Creat 0.95 0.50 - 0.99 mg/dL   GFR, Est Non African American 64 > OR = 60 mL/min/1.8m2   GFR, Est African American 74 > OR = 60 mL/min/1.16m2   BUN/Creatinine Ratio NOT APPLICABLE 6 - 22 (calc)   Sodium 142 135 - 146 mmol/L   Potassium 3.9 3.5 - 5.3 mmol/L   Chloride 101 98 - 110 mmol/L   CO2 28 20 - 32 mmol/L   Calcium 10.2 8.6 - 10.4 mg/dL   Total Protein 7.7 6.1 - 8.1 g/dL   Albumin 4.5 3.6 - 5.1  g/dL   Globulin 3.2 1.9 - 3.7 g/dL (calc)   AG Ratio 1.4 1.0 - 2.5 (calc)   Total Bilirubin 0.5 0.2 - 1.2 mg/dL   Alkaline phosphatase (APISO) 50 37 - 153 U/L   AST 31 10 - 35 U/L   ALT 28 6 - 29 U/L      Assessment & Plan:   Problem List Items Addressed This Visit      Cardiovascular and Mediastinum   Essential hypertension Controlled hypertension.  BP goal < 130/80.  Pt is working on lifestyle modifications.  Taking medications tolerating well without side effects. No current complications.  Plan: 1. Continue taking medications without changes 2. Obtain labs today for kidney function/glucose  3. Encouraged heart healthy diet and increasing exercise to 30 minutes most days of the week. 4. Check BP 1-2 x per week at home, keep log, and bring to clinic at next appointment. 5. Follow up 6 months.     Relevant Medications   amLODipine (NORVASC) 10 MG tablet   hydrochlorothiazide (HYDRODIURIL) 50 MG tablet   Other Relevant Orders   COMPLETE METABOLIC PANEL WITH GFR (Completed)     Other   Blood glucose elevated - Primary Status unknown.  Recheck labs.  Continue meds without changes today.  Refills provided. Followup prn after labs and 6 months.     Other Visit Diagnoses    Complication of anesthesia, initial encounter     Low risk complication of propofol is likely with fatigue, delayed cognition, perceived mental fatigue.   Reviewed with patient that it will continue to take time.  Follow-up prn.      Meds ordered this encounter  Medications  . amLODipine (NORVASC) 10 MG tablet    Sig: Take 1 tablet (10 mg total) by mouth daily.    Dispense:  90 tablet    Refill:  3    Order Specific Question:   Supervising Provider    Answer:   Olin Hauser [[id number]]  . hydrochlorothiazide (HYDRODIURIL) 50 MG tablet    Sig: Take 1 tablet (50 mg total) by mouth daily.    Dispense:  90 tablet    Refill:  3    Order Specific Question:   Supervising Provider    Answer:    Olin Hauser [[id number]]    Follow up plan: Return in about 6  months (around 05/05/2019) for hypertension.  Cassell Smiles, DNP, AGPCNP-BC Adult Gerontology Primary Care Nurse Practitioner [facility] 11/02/2018, 8:12 AM

## 2018-11-02 NOTE — Patient Instructions (Addendum)
[name],   Thank you for coming in to clinic today.  1. Continue current medications - Labs today  2. Continue eating a healthy diet and staying active.   Please schedule a follow-up appointment with Cassell Smiles, AGNP. Return in about 6 months (around 05/05/2019) for hypertension.  If you have any other questions or concerns, please feel free to call the clinic or send a message through [facility]. You may also schedule an earlier appointment if necessary.  You will receive a survey after today's visit either digitally by e-mail or paper by [employer]. Your experiences and feedback matter to [place].  Please respond so we know how we are doing as we provide care for you.   Cassell Smiles, DNP, AGNP-BC Adult Gerontology Nurse Practitioner [place]

## 2018-11-08 ENCOUNTER — Encounter: Payer: Self-pay | Admitting: Nurse Practitioner

## 2019-02-12 ENCOUNTER — Other Ambulatory Visit: Payer: Self-pay | Admitting: Nurse Practitioner

## 2019-02-12 NOTE — Telephone Encounter (Signed)
Patient's Rx send by Lauren in 03/20 for 90 days with 3 refill left detail message for patient to call the pharmacy.

## 2019-02-12 NOTE — Telephone Encounter (Signed)
Pt called requesting refill on  BP medication called into  [facility]

## 2019-04-13 ENCOUNTER — Other Ambulatory Visit: Payer: Self-pay

## 2019-04-13 ENCOUNTER — Encounter: Payer: BLUE CROSS/BLUE SHIELD | Admitting: Nurse Practitioner

## 2019-04-16 ENCOUNTER — Encounter: Payer: Self-pay | Admitting: Nurse Practitioner

## 2019-04-16 ENCOUNTER — Ambulatory Visit (INDEPENDENT_AMBULATORY_CARE_PROVIDER_SITE_OTHER): Payer: Self-pay | Admitting: Nurse Practitioner

## 2019-04-16 ENCOUNTER — Other Ambulatory Visit: Payer: Self-pay

## 2019-04-16 VITALS — BP 125/77 | HR 75 | Ht 68.0 in | Wt 182.8 lb

## 2019-04-16 DIAGNOSIS — Z0000 Encounter for general adult medical examination without abnormal findings: Secondary | ICD-10-CM

## 2019-04-16 DIAGNOSIS — Z23 Encounter for immunization: Secondary | ICD-10-CM

## 2019-04-16 NOTE — Progress Notes (Signed)
Subjective:    Patient ID: Linda Guzman, female    DOB: Jul 26, 1956, 63 y.o.   MRN: [id number]  Linda Guzman is a 63 y.o. female presenting on 04/16/2019 for Annual Exam  HPI Annual Physical Exam Patient has been feeling well.  They have no acute concerns today. Sleeps 8-9 hours per night uninterrupted.  HEALTH MAINTENANCE: Weight/BMI: generally stable - down 3 lbs Physical activity: regularly Diet: regular Seatbelt: always Sunscreen: rarely PAP: 04/09/2018 Mammogram: 09/07/2018 DEXA: Osteopenia 09/07/2018 - Repeat 2022 Colon Cancer Screen: Due next 10/26/2021 HIV/HEP C: neg Optometry: no Dentistry: no  VACCINES: Tetanus: up to date Influenza: Recommended   Past Medical History:  Diagnosis Date  . Hypertension    Past Surgical History:  Procedure Laterality Date  . COLONOSCOPY WITH PROPOFOL N/A 05/05/2018   Procedure: COLONOSCOPY WITH PROPOFOL;  Surgeon: Lucilla Lame, MD;  Location: [facility] ENDOSCOPY;  Service: Endoscopy;  Laterality: N/A;  . COLONOSCOPY WITH PROPOFOL N/A 10/27/2018   Procedure: COLONOSCOPY WITH PROPOFOL;  Surgeon: Lucilla Lame, MD;  Location: [facility] ENDOSCOPY;  Service: Endoscopy;  Laterality: N/A;  . FLEXIBLE SIGMOIDOSCOPY N/A 07/27/2018   Procedure: FLEXIBLE SIGMOIDOSCOPY;  Surgeon: Ileana Roup, MD;  Location: [facility] ORS;  Service: General;  Laterality: N/A;  . NO PAST SURGERIES    . TRANSANAL EXCISION OF RECTAL MASS N/A 07/27/2018   Procedure: TRANSANAL EXCISON OF RECTAL POLYP;  Surgeon: Ileana Roup, MD;  Location: [facility] ORS;  Service: General;  Laterality: N/A;   Social History   Socioeconomic History  . Marital status: Married    Spouse name: Not on file  . Number of children: Not on file  . Years of education: Not on file  . Highest education level: Not on file  Occupational History  . Not on file  Social Needs  . Financial resource strain: Not on file  . Food insecurity    Worry: Not on file    Inability: Not on file  . Transportation  needs    Medical: Not on file    Non-medical: Not on file  Tobacco Use  . Smoking status: Never Smoker  . Smokeless tobacco: Never Used  Substance and Sexual Activity  . Alcohol use: No  . Drug use: No  . Sexual activity: Yes    Birth control/protection: Post-menopausal  Lifestyle  . Physical activity    Days per week: Not on file    Minutes per session: Not on file  . Stress: Not on file  Relationships  . Social Herbalist on phone: Not on file    Gets together: Not on file    Attends religious service: Not on file    Active member of club or organization: Not on file    Attends meetings of clubs or organizations: Not on file    Relationship status: Not on file  . Intimate partner violence    Fear of current or ex partner: Not on file    Emotionally abused: Not on file    Physically abused: Not on file    Forced sexual activity: Not on file  Other Topics Concern  . Not on file  Social History Narrative  . Not on file   Family History  Problem Relation Age of Onset  . Hypertension Mother   . Stroke Sister   . Breast cancer Neg Hx    Current Outpatient Medications on File Prior to Visit  Medication Sig  . amLODipine (NORVASC) 10 MG tablet Take 1 tablet (10 mg  total) by mouth daily.  . cholecalciferol (VITAMIN D) 1000 units tablet Take 1,000 Units by mouth daily.  . hydrochlorothiazide (HYDRODIURIL) 50 MG tablet Take 1 tablet (50 mg total) by mouth daily.  . Magnesium Oxide 250 MG TABS Take 250 mg by mouth daily.    No current facility-administered medications on file prior to visit.     Review of Systems  Constitutional: Negative for chills and fever.  HENT: Negative for congestion and sore throat.   Eyes: Negative for pain.  Respiratory: Negative for cough, shortness of breath and wheezing.   Cardiovascular: Negative for chest pain, palpitations and leg swelling.  Gastrointestinal: Negative for abdominal pain, blood in stool, constipation, diarrhea,  nausea and vomiting.  Endocrine: Negative for polydipsia.  Genitourinary: Negative for dysuria, frequency, hematuria and urgency.  Musculoskeletal: Negative for back pain, myalgias and neck pain.  Skin: Negative.  Negative for rash.  Allergic/Immunologic: Negative for environmental allergies.  Neurological: Negative for dizziness, weakness and headaches.  Hematological: Does not bruise/bleed easily.  Psychiatric/Behavioral: Negative for dysphoric mood and suicidal ideas. The patient is not nervous/anxious.    Per HPI unless specifically indicated above     Objective:    BP 125/77 (BP Location: Right Arm, Patient Position: Sitting, Cuff Size: Normal)   Pulse 75   Ht 5\' 8"  (1.727 m)   Wt 182 lb 12.8 oz (82.9 kg)   BMI 27.79 kg/m   Wt Readings from Last 3 Encounters:  04/16/19 182 lb 12.8 oz (82.9 kg)  11/02/18 186 lb 3.2 oz (84.5 kg)  10/27/18 185 lb (83.9 kg)    Physical Exam Vitals signs and nursing note reviewed.  Constitutional:      General: She is not in acute distress.    Appearance: Normal appearance. She is well-developed. She is obese.  HENT:     Head: Normocephalic and atraumatic.     Right Ear: Tympanic membrane, ear canal and external ear normal.     Left Ear: Tympanic membrane, ear canal and external ear normal.     Nose: Nose normal.     Mouth/Throat:     Mouth: Mucous membranes are moist.     Pharynx: Oropharynx is clear.  Eyes:     Conjunctiva/sclera: Conjunctivae normal.     Pupils: Pupils are equal, round, and reactive to light.  Neck:     Musculoskeletal: Normal range of motion and neck supple.     Thyroid: No thyromegaly.     Vascular: No JVD.     Trachea: No tracheal deviation.  Cardiovascular:     Rate and Rhythm: Normal rate and regular rhythm.     Heart sounds: Normal heart sounds. No murmur. No friction rub. No gallop.   Pulmonary:     Effort: Pulmonary effort is normal. No respiratory distress.     Breath sounds: Normal breath sounds.   Abdominal:     General: Bowel sounds are normal. There is no distension.     Palpations: Abdomen is soft. There is no mass.     Tenderness: There is no abdominal tenderness.     Hernia: No hernia is present.  Musculoskeletal: Normal range of motion.  Lymphadenopathy:     Cervical: No cervical adenopathy.  Skin:    General: Skin is warm and dry.     Capillary Refill: Capillary refill takes less than 2 seconds.  Neurological:     General: No focal deficit present.     Mental Status: She is alert and oriented to person, place,  and time. Mental status is at baseline.     Cranial Nerves: No cranial nerve deficit.  Psychiatric:        Mood and Affect: Mood normal.        Behavior: Behavior normal.        Thought Content: Thought content normal.        Judgment: Judgment normal.     Results for orders placed or performed in visit on 11/02/18  COMPLETE METABOLIC PANEL WITH GFR  Result Value Ref Range   Glucose, Bld 111 (H) 65 - 99 mg/dL   BUN 10 7 - 25 mg/dL   Creat 0.95 0.50 - 0.99 mg/dL   GFR, Est Non African American 64 > OR = 60 mL/min/1.44m2   GFR, Est African American 74 > OR = 60 mL/min/1.21m2   BUN/Creatinine Ratio NOT APPLICABLE 6 - 22 (calc)   Sodium 142 135 - 146 mmol/L   Potassium 3.9 3.5 - 5.3 mmol/L   Chloride 101 98 - 110 mmol/L   CO2 28 20 - 32 mmol/L   Calcium 10.2 8.6 - 10.4 mg/dL   Total Protein 7.7 6.1 - 8.1 g/dL   Albumin 4.5 3.6 - 5.1 g/dL   Globulin 3.2 1.9 - 3.7 g/dL (calc)   AG Ratio 1.4 1.0 - 2.5 (calc)   Total Bilirubin 0.5 0.2 - 1.2 mg/dL   Alkaline phosphatase (APISO) 50 37 - 153 U/L   AST 31 10 - 35 U/L   ALT 28 6 - 29 U/L      Assessment & Plan:   Problem List Items Addressed This Visit    None    Visit Diagnoses    Encounter for annual physical exam    -  Primary   Relevant Orders   CBC with Differential/Platelet (Completed)   COMPLETE METABOLIC PANEL WITH GFR (Completed)   Hemoglobin A1c (Completed)   Lipid panel (Completed)   TSH +  free T4   Needs flu shot       Relevant Orders   Flu Vaccine QUAD 6+ mos PF IM (Fluarix Quad PF) (Completed)     Annual physical exam without new findings.  Well adult with no acute concerns.  Plan: 1. Obtain health maintenance screenings as above according to age. - Increase physical activity to 30 minutes most days of the week.  - Eat healthy diet high in vegetables and fruits; low in refined carbohydrates. - Screening labs and tests as ordered 2. Return 1 year for annual physical and 6 months for chronic disease management.     Follow up plan: Return in about 1 year (around 04/15/2020) for annual physical, 6 months for hypertension.  Cassell Smiles, DNP, AGPCNP-BC Adult Gerontology Primary Care Nurse Practitioner [facility] 04/16/2019, 8:55 AM

## 2019-04-16 NOTE — Progress Notes (Signed)
I

## 2019-04-16 NOTE — Patient Instructions (Addendum)
Linda Guzman,   Thank you for coming in to clinic today.  1. Continue staying active  2. For osteopenia: eat 3 servings of milk, yogurt, or cheese per day. If you do not eat this much, take a calcium supplement.   Please schedule a follow-up appointment with Cassell Smiles, AGNP. Return in about 1 year (around 04/15/2020) for annual physical, 6 months for hypertension.  If you have any other questions or concerns, please feel free to call the clinic or send a message through [place]. You may also schedule an earlier appointment if necessary.  You will receive a survey after today's visit either digitally by e-mail or paper by [employer]. Your experiences and feedback matter to [place].  Please respond so we know how we are doing as we provide care for you.   Cassell Smiles, DNP, AGNP-BC Adult Gerontology Nurse Practitioner [place]

## 2019-04-17 LAB — COMPLETE METABOLIC PANEL WITH GFR
AG Ratio: 1.5 (calc) (ref 1.0–2.5)
ALT: 27 U/L (ref 6–29)
AST: 24 U/L (ref 10–35)
Albumin: 4.4 g/dL (ref 3.6–5.1)
Alkaline phosphatase (APISO): 55 U/L (ref 37–153)
BUN: 10 mg/dL (ref 7–25)
CO2: 31 mmol/L (ref 20–32)
Calcium: 10.2 mg/dL (ref 8.6–10.4)
Chloride: 101 mmol/L (ref 98–110)
Creat: 0.97 mg/dL (ref 0.50–0.99)
GFR, Est African American: 73 mL/min/{1.73_m2} (ref >=60)
GFR, Est Non African American: 63 mL/min/{1.73_m2} (ref >=60)
Globulin: 3 g/dL (calc) (ref 1.9–3.7)
Glucose, Bld: 104 mg/dL — ABNORMAL HIGH (ref 65–99)
Potassium: 4.1 mmol/L (ref 3.5–5.3)
Sodium: 142 mmol/L (ref 135–146)
Total Bilirubin: 0.6 mg/dL (ref 0.2–1.2)
Total Protein: 7.4 g/dL (ref 6.1–8.1)

## 2019-04-17 LAB — CBC WITH DIFFERENTIAL/PLATELET
Absolute Monocytes: 358 cells/uL (ref 200–950)
Basophils Absolute: 39 cells/uL (ref 0–200)
Basophils Relative: 0.7 %
Eosinophils Absolute: 90 cells/uL (ref 15–500)
Eosinophils Relative: 1.6 %
HCT: 43.7 % (ref 35.0–45.0)
Hemoglobin: 13.8 g/dL (ref 11.7–15.5)
Lymphs Abs: 2246 cells/uL (ref 850–3900)
MCH: 27.5 pg (ref 27.0–33.0)
MCHC: 31.6 g/dL — ABNORMAL LOW (ref 32.0–36.0)
MCV: 87.1 fL (ref 80.0–100.0)
MPV: 10.4 fL (ref 7.5–12.5)
Monocytes Relative: 6.4 %
Neutro Abs: 2867 cells/uL (ref 1500–7800)
Neutrophils Relative %: 51.2 %
Platelets: 268 10*3/uL (ref 140–400)
RBC: 5.02 10*6/uL (ref 3.80–5.10)
RDW: 13.4 % (ref 11.0–15.0)
Total Lymphocyte: 40.1 %
WBC: 5.6 10*3/uL (ref 3.8–10.8)

## 2019-04-17 LAB — LIPID PANEL
Cholesterol: 196 mg/dL (ref <200)
HDL: 73 mg/dL (ref >=50)
LDL Cholesterol (Calc): 109 mg/dL (calc) — ABNORMAL HIGH
Non-HDL Cholesterol (Calc): 123 mg/dL (calc) (ref <130)
Total CHOL/HDL Ratio: 2.7 (calc) (ref <5.0)
Triglycerides: 59 mg/dL (ref <150)

## 2019-04-17 LAB — HEMOGLOBIN A1C
Hgb A1c MFr Bld: 6.1 % of total Hgb — ABNORMAL HIGH (ref <5.7)
Mean Plasma Glucose: 128 (calc)
eAG (mmol/L): 7.1 (calc)

## 2019-04-18 ENCOUNTER — Encounter: Payer: Self-pay | Admitting: Nurse Practitioner

## 2019-05-06 ENCOUNTER — Ambulatory Visit: Payer: Self-pay | Admitting: Nurse Practitioner

## 2019-10-12 ENCOUNTER — Ambulatory Visit: Payer: 59 | Attending: Internal Medicine

## 2019-10-12 DIAGNOSIS — Z20822 Contact with and (suspected) exposure to covid-19: Secondary | ICD-10-CM | POA: Insufficient documentation

## 2019-10-13 LAB — NOVEL CORONAVIRUS, NAA: SARS-CoV-2, NAA: NOT DETECTED

## 2019-10-18 ENCOUNTER — Ambulatory Visit: Payer: Self-pay | Admitting: Nurse Practitioner

## 2019-12-02 ENCOUNTER — Telehealth: Payer: Self-pay | Admitting: Nurse Practitioner

## 2019-12-02 DIAGNOSIS — I10 Essential (primary) hypertension: Secondary | ICD-10-CM

## 2019-12-02 NOTE — Telephone Encounter (Signed)
Refill request for HCTZ, and Amlodipine; no visit within last 6 months and no upcoming visit noted; Assisted by Marcello Moores, Interpreter # [phone]; attempted to contact pt; her husband answered and states that she does not need a refill, and he will come the facility today; refill request refused; will route to office for  notification

## 2020-01-04 ENCOUNTER — Ambulatory Visit (INDEPENDENT_AMBULATORY_CARE_PROVIDER_SITE_OTHER): Payer: Self-pay | Admitting: Family Medicine

## 2020-01-04 ENCOUNTER — Encounter: Payer: Self-pay | Admitting: Family Medicine

## 2020-01-04 ENCOUNTER — Other Ambulatory Visit: Payer: Self-pay

## 2020-01-04 VITALS — BP 129/83 | HR 69 | Temp 96.9°F | Wt 186.6 lb

## 2020-01-04 DIAGNOSIS — M5431 Sciatica, right side: Secondary | ICD-10-CM

## 2020-01-04 DIAGNOSIS — Z13220 Encounter for screening for lipoid disorders: Secondary | ICD-10-CM

## 2020-01-04 DIAGNOSIS — I10 Essential (primary) hypertension: Secondary | ICD-10-CM

## 2020-01-04 DIAGNOSIS — E559 Vitamin D deficiency, unspecified: Secondary | ICD-10-CM

## 2020-01-04 DIAGNOSIS — E119 Type 2 diabetes mellitus without complications: Secondary | ICD-10-CM | POA: Insufficient documentation

## 2020-01-04 DIAGNOSIS — R635 Abnormal weight gain: Secondary | ICD-10-CM

## 2020-01-04 DIAGNOSIS — R7303 Prediabetes: Secondary | ICD-10-CM

## 2020-01-04 DIAGNOSIS — R739 Hyperglycemia, unspecified: Secondary | ICD-10-CM

## 2020-01-04 DIAGNOSIS — M62838 Other muscle spasm: Secondary | ICD-10-CM

## 2020-01-04 LAB — POCT GLYCOSYLATED HEMOGLOBIN (HGB A1C): Hemoglobin A1C: 6.2 % — AB (ref 4.0–5.6)

## 2020-01-04 LAB — POCT URINALYSIS DIPSTICK
Bilirubin, UA: NEGATIVE
Blood, UA: NEGATIVE
Glucose, UA: NEGATIVE
Ketones, UA: NEGATIVE
Leukocytes, UA: NEGATIVE
Nitrite, UA: NEGATIVE
Protein, UA: NEGATIVE
Spec Grav, UA: <=1.005 — AB (ref 1.010–1.025)
Urobilinogen, UA: 0.2 E.U./dL
pH, UA: 7 (ref 5.0–8.0)

## 2020-01-04 MED ORDER — KETOROLAC TROMETHAMINE 60 MG/2ML IM SOLN
60.0000 mg | Freq: Once | INTRAMUSCULAR | Status: AC
  Administered 2020-01-04: 60 mg via INTRAMUSCULAR

## 2020-01-04 MED ORDER — IBUPROFEN 600 MG PO TABS: 600.0000 mg | ORAL_TABLET | Freq: Four times a day (QID) | ORAL | 0 refills | Status: DC | PRN

## 2020-01-04 MED ORDER — HYDROCHLOROTHIAZIDE 50 MG PO TABS: 50.0000 mg | ORAL_TABLET | Freq: Every day | ORAL | 3 refills | Status: DC

## 2020-01-04 MED ORDER — AMLODIPINE BESYLATE 10 MG PO TABS: 10.0000 mg | ORAL_TABLET | Freq: Every day | ORAL | 3 refills | Status: DC

## 2020-01-04 MED ORDER — BACLOFEN 10 MG PO TABS: 10.0000 mg | ORAL_TABLET | Freq: Three times a day (TID) | ORAL | 0 refills | Status: DC

## 2020-01-04 NOTE — Progress Notes (Signed)
POCT Urinalysis- normal

## 2020-01-04 NOTE — Assessment & Plan Note (Signed)
Right sided sciatica with right sided muscle spasm in right paraspinal muscle.  Rx written for ibuprofn 600mg  every 6 hours as needed for pain and for baclofen 10mg  up to 3x daily PRN for muscle spasm.  Discussed not driving or operating heavy machinery while taking these medications due to concerns for drowsiness/sedation.  Plan: 1. Rx for ibuprofen and baclofen sent to pharmacy on file, reviewed medications and dosages. 2. Provided with low back exercises for home use 3. To follow up as needed for this

## 2020-01-04 NOTE — Assessment & Plan Note (Signed)
Controlled hypertension.  Stable and at goal.  Pt is working on lifestyle modifications.  Taking medications tolerating well without side effects. No known complications.  Plan: 1. Continue taking amlodipine 10mg  daily and hydrochlorothiazide 50mg  daily 2. Obtain labs before next scheduled appointment 3. Encouraged heart healthy diet and increasing exercise to 30 minutes most days of the week, going no more than 2 days in a row without exercise. 4. Check BP 1-2 x per week at home, keep log, and bring to clinic at next appointment. 5. Follow up 6 months.

## 2020-01-04 NOTE — Patient Instructions (Signed)
Your medication refills have been sent to your pharmacy on file.  We will plan to repeat your labs at your next follow up visit in 6 months.  We have given you an injection of toradol today in clinic to help alleviate some of your right sided sciatica pain.  Be sure to wait 6 hours before taking the ibuprofen, if needed for pain.  I have sent in a prescription for Baclofen 10mg  to take 1 tablet 3x per day as needed for muscle spasm in right lower back.  Be sure to avoid driving or operating heavy machinery while taking this medication as it may cause sedation/drowsiness.  Try to get exercise a minimum of 30 minutes per day at least 5 days per week as well as  adequate water intake all while measuring blood pressure a few times per week.  Keep a blood pressure log and bring back to clinic at your next visit.  If your readings are consistently over 130/80 to contact our office/send me a MyChart message and we will see you sooner.  Can try DASH and Mediterranean diet options, avoiding processed foods, lowering sodium intake, avoiding pork products, and eating a plant based diet for optimal health.  Education and discussion with patient regarding hypertension as well as the effects on the organs and body.  Specifically, we spoke about kidney disease, kidney failure, heart attack, stroke and up to and including death, as likely outcomes if non-compliant with blood pressure regulation.  Discussed how all of these habits are attached to each other and each has the effect on each other.             Low Back Pain Exercises  See other page with pictures of each exercise.  Start with 1 or 2 of these exercises that you are most comfortable with. Do not do any exercises that cause you significant worsening pain. Some of these may cause some "stretching soreness" but it should go away after you stop the exercise, and get better over time. Gradually increase up to 3-4 exercises as tolerated.  Standing  hamstring stretch: Place the heel of your leg on a stool about 15 inches high. Keep your knee straight. Lean forward, bending at the hips until you feel a mild stretch in the back of your thigh. Make sure you do not roll your shoulders and bend at the waist when doing this or you will stretch your lower back instead. Hold the stretch for 15 to 30 seconds. Repeat 3 times. Repeat the same stretch on your other leg.  Cat and camel: Get down on your hands and knees. Let your stomach sag, allowing your back to curve downward. Hold this position for 5 seconds. Then arch your back and hold for 5 seconds. Do 3 sets of 10.  Quadriped Arm/Leg Raises: Get down on your hands and knees. Tighten your abdominal muscles to stiffen your spine. While keeping your abdominals tight, raise one arm and the opposite leg away from you. Hold this position for 5 seconds. Lower your arm and leg slowly and alternate sides. Do this 10 times on each side.  Pelvic tilt: Lie on your back with your knees bent and your feet flat on the floor. Tighten your abdominal muscles and push your lower back into the floor. Hold this position for 5 seconds, then relax. Do 3 sets of 10.  Partial curl: Lie on your back with your knees bent and your feet flat on the floor. Tighten your stomach muscles  and flatten your back against the floor. Tuck your chin to your chest. With your hands stretched out in front of you, curl your upper body forward until your shoulders clear the floor. Hold this position for 3 seconds. Don't hold your breath. It helps to breathe out as you lift your shoulders up. Relax. Repeat 10 times. Build to 3 sets of 10. To challenge yourself, clasp your hands behind your head and keep your elbows out to the side.  Lower trunk rotation: Lie on your back with your knees bent and your feet flat on the floor. Tighten your abdominal muscles and push your lower back into the floor. Keeping your shoulders down flat, gently rotate your legs  to one side, then the other as far as you can. Repeat 10 to 20 times.  Single knee to chest stretch: Lie on your back with your legs straight out in front of you. Bring one knee up to your chest and grasp the back of your thigh. Pull your knee toward your chest, stretching your buttock muscle. Hold this position for 15 to 30 seconds and return to the starting position. Repeat 3 times on each side.  Double knee to chest: Lie on your back with your knees bent and your feet flat on the floor. Tighten your abdominal muscles and push your lower back into the floor. Pull both knees up to your chest. Hold for 5 seconds and repeat 10 to 20 times.  Warning symptoms of possible EMERGENCY SPINAL CORD COMPRESSION (also called CAUDA EQUINA SYNDROME) - Leg or muscle weakness, difficulty lifting or heavy muscles that aren't working (not talking about pain or numbness) - Numbness in your groin or saddle region - Unable to control your bowel or bladder with incontinence IF you get any of these symptoms this potentially could be a serious spinal cord injury and recommend that you go directly to the Hospital Emergency Dept  We will plan to see you back in 6 months for hypertension follow up and as needed for sciatica pain  You will receive a survey after today's visit either digitally by e-mail or paper by [place] mail. Your experiences and feedback matter to [place].  Please respond so we know how we are doing as we provide care for you.  Call us with any questions/concerns/needs.  It is my goal to be available to you for your health concerns.  Thanks for choosing me to be a partner in your healthcare needs!  Harlin Rain, FNP-C Family Nurse Practitioner [facility] Phone: [phone]

## 2020-01-04 NOTE — Progress Notes (Signed)
Subjective:    Patient ID: Linda Guzman, female    DOB: October 23, 1955, 64 y.o.   MRN: [id number]  Linda Guzman is a 64 y.o. female presenting on 01/04/2020 for Hypertension (pt been off bp medication x 3 days, because she needed an appt.) and Sciatica (intermittent Rt lower back pain , buttocks that radiates down the Rt legs. The pain normally last about two week and subsided. The pain  makes it difficult to walk and move. )   HPI  Hypertension - She is not checking BP at home or outside of clinic.    - Current medications: amlodipine 10mg  and hydrochlorothiazide 50mg  daily, tolerating well without side effects - She is not currently symptomatic. - Pt denies headache, lightheadedness, dizziness, changes in vision, chest tightness/pressure, palpitations, leg swelling, sudden loss of speech or loss of consciousness. - She  reports no regular exercise routine. - Her diet is high in salt, high in fat, and high in carbohydrates.  Reports has been having right sided sciatica pain that comes and goes.  States starts in her right lower back and travels through the right buttocks through the top of the right thigh.  Denies fall/trauma/injury.  Has come/gone over the past two weeks, is currently present.  Denies changes in bowel/bladder function, saddle anesthesia, numbness, tingling, weakness.  Depression screen [facility] 2/9 04/16/2019 04/09/2018 12/11/2016  Decreased Interest 0 0 0  Down, Depressed, Hopeless 0 0 0  PHQ - 2 Score 0 0 0    Social History   Tobacco Use  . Smoking status: Never Smoker  . Smokeless tobacco: Never Used  Substance Use Topics  . Alcohol use: No  . Drug use: No    Review of Systems  Constitutional: Negative.   HENT: Negative.   Eyes: Negative.   Respiratory: Negative.   Cardiovascular: Negative.   Gastrointestinal: Negative.   Endocrine: Negative.   Genitourinary: Negative.   Musculoskeletal: Positive for back pain. Negative for arthralgias, gait problem, joint  swelling, myalgias, neck pain and neck stiffness.  Skin: Negative.   Allergic/Immunologic: Negative.   Neurological: Negative.   Hematological: Negative.   Psychiatric/Behavioral: Negative.    Per HPI unless specifically indicated above     Objective:    BP 129/83 (BP Location: Left Arm, Patient Position: Sitting, Cuff Size: Normal)   Pulse 69   Temp (!) 96.9 F (36.1 C) (Temporal)   Wt 186 lb 9.6 oz (84.6 kg)   BMI 28.37 kg/m   Wt Readings from Last 3 Encounters:  01/04/20 186 lb 9.6 oz (84.6 kg)  04/16/19 182 lb 12.8 oz (82.9 kg)  11/02/18 186 lb 3.2 oz (84.5 kg)    Physical Exam Vitals reviewed.  Constitutional:      General: She is not in acute distress.    Appearance: Normal appearance. She is well-developed, well-groomed and overweight. She is not ill-appearing or toxic-appearing.  HENT:     Head: Normocephalic.  Eyes:     General: Lids are normal. Vision grossly intact.        Right eye: No discharge.        Left eye: No discharge.     Extraocular Movements: Extraocular movements intact.     Conjunctiva/sclera: Conjunctivae normal.     Pupils: Pupils are equal, round, and reactive to light.  Cardiovascular:     Rate and Rhythm: Normal rate and regular rhythm.     Pulses: Normal pulses.     Heart sounds: Normal heart sounds. No murmur. No friction rub.  No gallop.   Pulmonary:     Effort: Pulmonary effort is normal. No respiratory distress.     Breath sounds: Normal breath sounds.  Musculoskeletal:        General: Tenderness present. No swelling.     Cervical back: Normal.     Thoracic back: Normal.     Lumbar back: Spasms and tenderness present. No swelling, deformity, signs of trauma or lacerations. Normal range of motion. Negative right straight leg raise test and negative left straight leg raise test.       Back:     Right lower leg: No edema.     Left lower leg: No edema.  Feet:     Right foot:     Skin integrity: Skin integrity normal.     Left  foot:     Skin integrity: Skin integrity normal.  Skin:    General: Skin is warm and dry.     Capillary Refill: Capillary refill takes less than 2 seconds.  Neurological:     General: No focal deficit present.     Mental Status: She is alert and oriented to person, place, and time.     Cranial Nerves: No cranial nerve deficit.     Sensory: No sensory deficit.     Motor: No weakness.     Coordination: Coordination normal.     Gait: Gait normal.  Psychiatric:        Attention and Perception: Attention and perception normal.        Mood and Affect: Mood and affect normal.        Speech: Speech normal.        Behavior: Behavior normal. Behavior is cooperative.        Thought Content: Thought content normal.        Cognition and Memory: Cognition and memory normal.        Judgment: Judgment normal.    Results for orders placed or performed in visit on 01/04/20  POCT glycosylated hemoglobin (Hb A1C)  Result Value Ref Range   Hemoglobin A1C 6.2 (A) 4.0 - 5.6 %   HbA1c POC (<> result, manual entry)     HbA1c, POC (prediabetic range)     HbA1c, POC (controlled diabetic range)    POCT Urinalysis Dipstick  Result Value Ref Range   Color, UA Yellow    Clarity, UA clear    Glucose, UA Negative Negative   Bilirubin, UA negative    Ketones, UA negative    Spec Grav, UA <=1.005 (A) 1.010 - 1.025   Blood, UA negative    pH, UA 7.0 5.0 - 8.0   Protein, UA Negative Negative   Urobilinogen, UA 0.2 0.2 or 1.0 E.U./dL   Nitrite, UA negative    Leukocytes, UA Negative Negative   Appearance     Odor        Assessment & Plan:   Problem List Items Addressed This Visit      Cardiovascular and Mediastinum   Essential hypertension    Controlled hypertension.  Stable and at goal.  Pt is working on lifestyle modifications.  Taking medications tolerating well without side effects. No known complications.  Plan: 1. Continue taking amlodipine 10mg  daily and hydrochlorothiazide 50mg  daily 2.  Obtain labs before next scheduled appointment 3. Encouraged heart healthy diet and increasing exercise to 30 minutes most days of the week, going no more than 2 days in a row without exercise. 4. Check BP 1-2 x per week at home, keep  log, and bring to clinic at next appointment. 5. Follow up 6 months.         Relevant Medications   amLODipine (NORVASC) 10 MG tablet   hydrochlorothiazide (HYDRODIURIL) 50 MG tablet   Other Relevant Orders   POCT Urinalysis Dipstick (Completed)     Nervous and Auditory   Right sided sciatica    Right sided sciatica with right sided muscle spasm in right paraspinal muscle.  Rx written for ibuprofn 600mg  every 6 hours as needed for pain and for baclofen 10mg  up to 3x daily PRN for muscle spasm.  Discussed not driving or operating heavy machinery while taking these medications due to concerns for drowsiness/sedation.  Plan: 1. Rx for ibuprofen and baclofen sent to pharmacy on file, reviewed medications and dosages. 2. Provided with low back exercises for home use 3. To follow up as needed for this      Relevant Medications   baclofen (LIORESAL) 10 MG tablet   ibuprofen (ADVIL) 600 MG tablet     Other   Blood glucose elevated - Primary   Relevant Orders   POCT glycosylated hemoglobin (Hb A1C) (Completed)   Prediabetes    A1C 6.2%.  Discussed risk of turning into diabetes and how important it is to make dietary modifications.  Prediabetes paperwork/handout provided.       Other Visit Diagnoses    Vitamin D deficiency       Weight gain       Screening for lipid disorders       Muscle spasm       Relevant Medications   baclofen (LIORESAL) 10 MG tablet   ibuprofen (ADVIL) 600 MG tablet   ketorolac (TORADOL) injection 60 mg (Completed)      Meds ordered this encounter  Medications  . amLODipine (NORVASC) 10 MG tablet    Sig: Take 1 tablet (10 mg total) by mouth daily.    Dispense:  90 tablet    Refill:  3  . hydrochlorothiazide (HYDRODIURIL)  50 MG tablet    Sig: Take 1 tablet (50 mg total) by mouth daily.    Dispense:  90 tablet    Refill:  3  . baclofen (LIORESAL) 10 MG tablet    Sig: Take 1 tablet (10 mg total) by mouth 3 (three) times daily.    Dispense:  30 each    Refill:  0  . ibuprofen (ADVIL) 600 MG tablet    Sig: Take 1 tablet (600 mg total) by mouth every 6 (six) hours as needed.    Dispense:  30 tablet    Refill:  0  . ketorolac (TORADOL) injection 60 mg      Follow up plan: Return in about 6 months (around 07/06/2020) for HTN Follow up and Labs.   Harlin Rain, [place] [facility] Nurse Practitioner [facility] 01/04/2020, 2:26 PM

## 2020-01-04 NOTE — Assessment & Plan Note (Signed)
A1C 6.2%.  Discussed risk of turning into diabetes and how important it is to make dietary modifications.  Prediabetes paperwork/handout provided.

## 2020-01-08 ENCOUNTER — Other Ambulatory Visit: Payer: Self-pay | Admitting: Family Medicine

## 2020-01-08 DIAGNOSIS — M62838 Other muscle spasm: Secondary | ICD-10-CM

## 2020-01-08 DIAGNOSIS — M5431 Sciatica, right side: Secondary | ICD-10-CM

## 2020-01-11 ENCOUNTER — Other Ambulatory Visit: Payer: Self-pay | Admitting: Family Medicine

## 2020-01-11 DIAGNOSIS — M5431 Sciatica, right side: Secondary | ICD-10-CM

## 2020-01-11 DIAGNOSIS — M62838 Other muscle spasm: Secondary | ICD-10-CM

## 2020-01-11 NOTE — Telephone Encounter (Signed)
Requested medications are due for refill today?  Yes - this medication refill cannot be delegated.    Requested medications are on active medication list?  Yes  Last Refill:   01/04/2020  # 30 with no refills  Future visit scheduled?  Yes  Notes to Clinic:  This medication refill cannot be delegated.

## 2020-02-13 ENCOUNTER — Other Ambulatory Visit: Payer: Self-pay | Admitting: Family Medicine

## 2020-02-13 DIAGNOSIS — M62838 Other muscle spasm: Secondary | ICD-10-CM

## 2020-02-13 DIAGNOSIS — M5431 Sciatica, right side: Secondary | ICD-10-CM

## 2020-02-13 NOTE — Telephone Encounter (Signed)
Requested Prescriptions  Pending Prescriptions Disp Refills  . ibuprofen (ADVIL) 600 MG tablet [Pharmacy Med Name: IBUPROFEN 600MG  TABLETS] 120 tablet 0    Sig: TAKE 1 TABLET(600 MG) BY MOUTH EVERY 6 HOURS AS NEEDED     Analgesics:  NSAIDS Passed - 02/13/2020 12:54 PM      Passed - Cr in normal range and within 360 days    Creat  Date Value Ref Range Status  04/16/2019 0.97 0.50 - 0.99 mg/dL Final    Comment:    For patients >64 years of age, the reference limit for Creatinine is approximately 13% higher for people identified as African-American. .          Passed - HGB in normal range and within 360 days    Hemoglobin  Date Value Ref Range Status  04/16/2019 13.8 11.7 - 15.5 g/dL Final         Passed - Patient is not pregnant      Passed - Valid encounter within last 12 months    Recent Outpatient Visits          1 month ago Blood glucose elevated   [place], FNP   10 months ago Encounter for annual physical exam   [facility] Merrilyn Puma, Jerrel Ivory, NP   1 year ago Blood glucose elevated   [facility] Merrilyn Puma, Jerrel Ivory, NP   1 year ago Encounter for annual physical exam   [facility] Mikey College, NP   2 years ago Chronic right-sided low back pain with right-sided sciatica   [facility] Mikey College, NP      Future Appointments            In 4 months [place], Lupita Raider, [facility], [facility]

## 2020-02-16 ENCOUNTER — Telehealth: Payer: Self-pay

## 2020-02-16 NOTE — Telephone Encounter (Signed)
Copied from [place] [phone]. Topic: Quick Communication - Rx Refill/Question >> Feb 16, 2020  9:26 AM Hinda Lenis D wrote: PT need a refill on a medication, she forgot the name / please call the PT back

## 2020-02-17 NOTE — Telephone Encounter (Signed)
Called pt could not leave message VM was full.

## 2020-02-18 ENCOUNTER — Telehealth: Payer: Self-pay

## 2020-02-18 NOTE — Telephone Encounter (Signed)
Called pt told her the names of the medications that she needs and that she already had RFs that all she needed to do was go to the pharmacy and ask for a RF. Pt verbalized understanding.  KP

## 2020-02-18 NOTE — Telephone Encounter (Signed)
Pt.'s husband calling to review medication list. Verbalizes understanding.

## 2020-03-12 ENCOUNTER — Other Ambulatory Visit: Payer: Self-pay | Admitting: Family Medicine

## 2020-03-12 DIAGNOSIS — M5431 Sciatica, right side: Secondary | ICD-10-CM

## 2020-03-12 DIAGNOSIS — M62838 Other muscle spasm: Secondary | ICD-10-CM

## 2020-03-12 NOTE — Telephone Encounter (Signed)
Requested Prescriptions  Pending Prescriptions Disp Refills  . ibuprofen (ADVIL) 600 MG tablet [Pharmacy Med Name: IBUPROFEN 600MG  TABLETS] 120 tablet 0    Sig: TAKE 1 TABLET(600 MG) BY MOUTH EVERY 6 HOURS AS NEEDED     Analgesics:  NSAIDS Passed - 03/12/2020  7:47 AM      Passed - Cr in normal range and within 360 days    Creat  Date Value Ref Range Status  04/16/2019 0.97 0.50 - 0.99 mg/dL Final    Comment:    For patients >64 years of age, the reference limit for Creatinine is approximately 13% higher for people identified as African-American. .          Passed - HGB in normal range and within 360 days    Hemoglobin  Date Value Ref Range Status  04/16/2019 13.8 11.7 - 15.5 g/dL Final         Passed - Patient is not pregnant      Passed - Valid encounter within last 12 months    Recent Outpatient Visits          2 months ago Blood glucose elevated   [facility], Lupita Raider, [place]   11 months ago Encounter for annual physical exam   [facility] Merrilyn Puma, Jerrel Ivory, NP   1 year ago Blood glucose elevated   [facility] Merrilyn Puma, Jerrel Ivory, NP   1 year ago Encounter for annual physical exam   [facility] Merrilyn Puma, Jerrel Ivory, NP   2 years ago Chronic right-sided low back pain with right-sided sciatica   [place], NP      Future Appointments            In 3 months [place], Lupita Raider, [facility], [place]           . baclofen (LIORESAL) 10 MG tablet [Pharmacy Med Name: BACLOFEN 10MG  TABLETS] 90 tablet     Sig: TAKE 1 TABLET(10 MG) BY MOUTH THREE TIMES DAILY     Not Delegated - Analgesics:  Muscle Relaxants Failed - 03/12/2020  7:47 AM      Failed - This refill cannot be delegated      Passed - Valid encounter within last 6 months    Recent Outpatient Visits          2 months ago Blood glucose elevated   [facility],  Lupita Raider, FNP   11 months ago Encounter for annual physical exam   [facility] Merrilyn Puma, Jerrel Ivory, NP   1 year ago Blood glucose elevated   [facility] Merrilyn Puma, Jerrel Ivory, NP   1 year ago Encounter for annual physical exam   [facility] Mikey College, NP   2 years ago Chronic right-sided low back pain with right-sided sciatica   [facility] Mikey College, NP      Future Appointments            In 3 months Malfi, Lupita Raider, [facility], [facility]

## 2020-03-12 NOTE — Telephone Encounter (Signed)
Requested medication (s) are due for refill today: yes  Requested medication (s) are on the active medication list: yes  Last refill:  01/11/20  Future visit scheduled: yes  Notes to clinic:  med not delegated to NT to RF   Requested Prescriptions  Pending Prescriptions Disp Refills   baclofen (LIORESAL) 10 MG tablet [Pharmacy Med Name: BACLOFEN 10MG  TABLETS] 90 tablet     Sig: TAKE 1 TABLET(10 MG) BY MOUTH THREE TIMES DAILY      Not Delegated - Analgesics:  Muscle Relaxants Failed - 03/12/2020  7:47 AM      Failed - This refill cannot be delegated      Passed - Valid encounter within last 6 months    Recent Outpatient Visits           2 months ago Blood glucose elevated   [facility], Lupita Raider, FNP   11 months ago Encounter for annual physical exam   [facility] Merrilyn Puma, Jerrel Ivory, NP   1 year ago Blood glucose elevated   [facility] Merrilyn Puma, Jerrel Ivory, NP   1 year ago Encounter for annual physical exam   [facility] Merrilyn Puma, Jerrel Ivory, NP   2 years ago Chronic right-sided low back pain with right-sided sciatica   [place], NP       Future Appointments             In 3 months [place], [facility], [facility]             Signed Prescriptions Disp Refills   ibuprofen (ADVIL) 600 MG tablet 120 tablet 0    Sig: TAKE 1 TABLET(600 MG) BY MOUTH EVERY 6 HOURS AS NEEDED      Analgesics:  NSAIDS Passed - 03/12/2020  7:47 AM      Passed - Cr in normal range and within 360 days    Creat  Date Value Ref Range Status  04/16/2019 0.97 0.50 - 0.99 mg/dL Final    Comment:    For patients >86 years of age, the reference limit for Creatinine is approximately 13% higher for people identified as African-American. .           Passed - HGB in normal range and within 360 days    Hemoglobin  Date Value Ref Range Status  04/16/2019 13.8  11.7 - 15.5 g/dL Final          Passed - Patient is not pregnant      Passed - Valid encounter within last 12 months    Recent Outpatient Visits           2 months ago Blood glucose elevated   [place], [place]   11 months ago Encounter for annual physical exam   [facility] Merrilyn Puma, Jerrel Ivory, NP   1 year ago Blood glucose elevated   [facility] Merrilyn Puma, Jerrel Ivory, NP   1 year ago Encounter for annual physical exam   [facility] Mikey College, NP   2 years ago Chronic right-sided low back pain with right-sided sciatica   [facility] Mikey College, NP       Future Appointments             In 3 months [place], Lupita Raider, [facility], [facility]

## 2020-04-17 ENCOUNTER — Encounter: Payer: Self-pay | Admitting: Nurse Practitioner

## 2020-07-04 ENCOUNTER — Ambulatory Visit (INDEPENDENT_AMBULATORY_CARE_PROVIDER_SITE_OTHER): Payer: Self-pay | Admitting: Family Medicine

## 2020-07-04 ENCOUNTER — Other Ambulatory Visit: Payer: Self-pay

## 2020-07-04 ENCOUNTER — Encounter: Payer: Self-pay | Admitting: Family Medicine

## 2020-07-04 VITALS — BP 114/70 | HR 76 | Temp 98.5°F | Resp 18 | Ht 68.0 in | Wt 187.2 lb

## 2020-07-04 DIAGNOSIS — R7303 Prediabetes: Secondary | ICD-10-CM

## 2020-07-04 DIAGNOSIS — M62838 Other muscle spasm: Secondary | ICD-10-CM

## 2020-07-04 DIAGNOSIS — Z23 Encounter for immunization: Secondary | ICD-10-CM

## 2020-07-04 DIAGNOSIS — Z79899 Other long term (current) drug therapy: Secondary | ICD-10-CM

## 2020-07-04 DIAGNOSIS — I10 Essential (primary) hypertension: Secondary | ICD-10-CM

## 2020-07-04 LAB — POCT GLYCOSYLATED HEMOGLOBIN (HGB A1C): Hemoglobin A1C: 6.3 % — AB (ref 4.0–5.6)

## 2020-07-04 MED ORDER — IBUPROFEN 600 MG PO TABS: ORAL_TABLET | ORAL | 0 refills | Status: DC

## 2020-07-04 MED ORDER — HYDROCHLOROTHIAZIDE 50 MG PO TABS: 50.0000 mg | ORAL_TABLET | Freq: Every day | ORAL | 3 refills | Status: DC

## 2020-07-04 MED ORDER — AMLODIPINE BESYLATE 10 MG PO TABS: 10.0000 mg | ORAL_TABLET | Freq: Every day | ORAL | 3 refills | Status: DC

## 2020-07-04 NOTE — Progress Notes (Signed)
Subjective:    Patient ID: Linda Guzman, female    DOB: [birth year], 64 y.o.   MRN: [id number]  Linda Guzman is a 64 y.o. female presenting on 07/04/2020 for Hypertension   HPI   Ms. Hair presents to clinic for a follow up on her hypertension and prediabetes.  No acute concerns today.  Hypertension - She is not checking BP at home or outside of clinic.    - Current medications: amlodipine 10mg  and hydrochlorothiazide 50mg  daily, tolerating well without side effects - She is not currently symptomatic. - Pt denies headache, lightheadedness, dizziness, changes in vision, chest tightness/pressure, palpitations, leg swelling, sudden loss of speech or loss of consciousness. - She  reports no regular exercise routine. - Her diet is moderate in salt, moderate in fat, and moderate in carbohydrates.  Depression screen [facility] 2/9 07/04/2020 04/16/2019 04/09/2018  Decreased Interest 0 0 0  Down, Depressed, Hopeless 0 0 0  PHQ - 2 Score 0 0 0    Social History   Tobacco Use  . Smoking status: Never Smoker  . Smokeless tobacco: Never Used  Vaping Use  . Vaping Use: Never used  Substance Use Topics  . Alcohol use: No  . Drug use: No    Review of Systems  Constitutional: Negative.   HENT: Negative.   Eyes: Negative.   Respiratory: Negative.   Cardiovascular: Negative.   Gastrointestinal: Negative.   Endocrine: Negative.   Genitourinary: Negative.   Musculoskeletal: Negative.   Skin: Negative.   Allergic/Immunologic: Negative.   Neurological: Negative.   Hematological: Negative.   Psychiatric/Behavioral: Negative.    Per HPI unless specifically indicated above     Objective:    BP 114/70 (BP Location: Right Arm, Patient Position: Sitting, Cuff Size: Normal)   Pulse 76   Temp 98.5 F (36.9 C) (Oral)   Resp 18   Ht 5\' 8"  (1.727 m)   Wt 187 lb 3.2 oz (84.9 kg)   SpO2 100%   BMI 28.46 kg/m   Wt Readings from Last 3 Encounters:  07/04/20 187 lb 3.2 oz (84.9 kg)  01/04/20  186 lb 9.6 oz (84.6 kg)  04/16/19 182 lb 12.8 oz (82.9 kg)    Physical Exam Vitals and nursing note reviewed.  Constitutional:      General: She is not in acute distress.    Appearance: Normal appearance. She is well-developed, well-groomed and overweight. She is not ill-appearing or toxic-appearing.  HENT:     Head: Normocephalic and atraumatic.     Nose:     Comments: Lizbeth Bark is in place, covering mouth and nose. Eyes:     General: Lids are normal. Vision grossly intact.        Right eye: No discharge.        Left eye: No discharge.     Extraocular Movements: Extraocular movements intact.     Conjunctiva/sclera: Conjunctivae normal.     Pupils: Pupils are equal, round, and reactive to light.  Cardiovascular:     Rate and Rhythm: Normal rate and regular rhythm.     Pulses: Normal pulses.          Dorsalis pedis pulses are 2+ on the right side and 2+ on the left side.     Heart sounds: Normal heart sounds. No murmur heard.  No friction rub. No gallop.   Pulmonary:     Effort: Pulmonary effort is normal. No respiratory distress.     Breath sounds: Normal breath sounds.  Musculoskeletal:  Right lower leg: No edema.     Left lower leg: No edema.  Skin:    General: Skin is warm and dry.     Capillary Refill: Capillary refill takes less than 2 seconds.  Neurological:     General: No focal deficit present.     Mental Status: She is alert and oriented to person, place, and time.  Psychiatric:        Attention and Perception: Attention and perception normal.        Mood and Affect: Mood and affect normal.        Speech: Speech normal.        Behavior: Behavior normal. Behavior is cooperative.        Thought Content: Thought content normal.        Cognition and Memory: Cognition and memory normal.        Judgment: Judgment normal.    Results for orders placed or performed in visit on 07/04/20  POCT glycosylated hemoglobin (Hb A1C)  Result Value Ref Range   Hemoglobin A1C  6.3 (A) 4.0 - 5.6 %   HbA1c POC (<> result, manual entry)     HbA1c, POC (prediabetic range)     HbA1c, POC (controlled diabetic range)        Assessment & Plan:   Problem List Items Addressed This Visit      Cardiovascular and Mediastinum   Essential hypertension - Primary    Controlled hypertension.  BP is at goal < 130/80.  Pt is working on lifestyle modifications.  Taking medications tolerating well without side effects.  Complications:  Overweight, Prediabetes   Plan: 1. Continue taking amlodipine 10mg  and hydrochlorothiazide 50mg  daily 2. Obtain labs today  3. Encouraged heart healthy diet and increasing exercise to 30 minutes most days of the week, going no more than 2 days in a row without exercise. 4. Check BP 1-2 x per week at home, keep log, and bring to clinic at next appointment. 5. Follow up 6 months.       Relevant Medications   amLODipine (NORVASC) 10 MG tablet   hydrochlorothiazide (HYDRODIURIL) 50 MG tablet   Other Relevant Orders   CBC with Differential   COMPLETE METABOLIC PANEL WITH GFR     Other   Prediabetes    A1C 6.3%, increased from last visit at 6.2%.  Discussed dietary and lifestyle modifications, increasing water and vegetable intake, avoiding white starches/sugars to help prevent prediabetes from progressing into diabetes.  Will recheck A1C in 6 months.      Relevant Orders   POCT glycosylated hemoglobin (Hb A1C) (Completed)   Needs flu shot    Pt < age 51.  Needs annual influenza vaccine.  VIS provided.  Plan: 1. Administer Quad flu vaccine.       Relevant Orders   Flu Vaccine QUAD 6+ mos PF IM (Fluarix Quad PF) (Completed)    Other Visit Diagnoses    Long-term use of high-risk medication       Relevant Orders   Lipid Profile   TSH + free T4   Muscle spasm       Relevant Medications   ibuprofen (ADVIL) 600 MG tablet      Meds ordered this encounter  Medications  . amLODipine (NORVASC) 10 MG tablet    Sig: Take 1 tablet (10 mg  total) by mouth daily.    Dispense:  90 tablet    Refill:  3  . hydrochlorothiazide (HYDRODIURIL) 50 MG tablet  Sig: Take 1 tablet (50 mg total) by mouth daily.    Dispense:  90 tablet    Refill:  3  . ibuprofen (ADVIL) 600 MG tablet    Sig: TAKE 1 TABLET(600 MG) BY MOUTH EVERY 6 HOURS AS NEEDED    Dispense:  120 tablet    Refill:  0   Follow up plan: Return in about 6 months (around 01/01/2021) for HTN F/U.   Harlin Rain, [place] [facility] Nurse Practitioner [facility] 07/04/2020, 10:26 AM

## 2020-07-04 NOTE — Assessment & Plan Note (Signed)
Controlled hypertension.  BP is at goal < 130/80.  Pt is working on lifestyle modifications.  Taking medications tolerating well without side effects.  Complications:  Overweight, Prediabetes   Plan: 1. Continue taking amlodipine 10mg  and hydrochlorothiazide 50mg  daily 2. Obtain labs today  3. Encouraged heart healthy diet and increasing exercise to 30 minutes most days of the week, going no more than 2 days in a row without exercise. 4. Check BP 1-2 x per week at home, keep log, and bring to clinic at next appointment. 5. Follow up 6 months.

## 2020-07-04 NOTE — Assessment & Plan Note (Signed)
A1C 6.3%, increased from last visit at 6.2%.  Discussed dietary and lifestyle modifications, increasing water and vegetable intake, avoiding white starches/sugars to help prevent prediabetes from progressing into diabetes.  Will recheck A1C in 6 months.

## 2020-07-04 NOTE — Patient Instructions (Signed)
Continue all medications as prescribed.  Have your labs drawn and we will contact you with the results.  Try to get exercise a minimum of 30 minutes per day at least 5 days per week as well as  adequate water intake all while measuring blood pressure a few times per week.  Keep a blood pressure log and bring back to clinic at your next visit.  If your readings are consistently over 130/80 to contact our office/send me a MyChart message and we will see you sooner.  Can try DASH and Mediterranean diet options, avoiding processed foods, lowering sodium intake, avoiding pork products, and eating a plant based diet for optimal health.  We will plan to see you back in 6 months for hypertension follow up visit  You will receive a survey after today's visit either digitally by e-mail or paper by [place] mail. Your experiences and feedback matter to [place].  Please respond so we know how we are doing as we provide care for you.  Call us with any questions/concerns/needs.  It is my goal to be available to you for your health concerns.  Thanks for choosing me to be a partner in your healthcare needs!  Harlin Rain, FNP-C Family Nurse Practitioner [facility] Phone: [phone]

## 2020-07-04 NOTE — Assessment & Plan Note (Signed)
Pt < age 64.  Needs annual influenza vaccine.  VIS provided.  Plan: 1. Administer Quad flu vaccine.

## 2020-07-05 ENCOUNTER — Other Ambulatory Visit: Payer: Self-pay | Admitting: Family Medicine

## 2020-07-05 DIAGNOSIS — E785 Hyperlipidemia, unspecified: Secondary | ICD-10-CM

## 2020-07-05 LAB — CBC WITH DIFFERENTIAL/PLATELET
Absolute Monocytes: 378 cells/uL (ref 200–950)
Basophils Absolute: 43 cells/uL (ref 0–200)
Basophils Relative: 0.7 %
Eosinophils Absolute: 128 cells/uL (ref 15–500)
Eosinophils Relative: 2.1 %
HCT: 42.9 % (ref 35.0–45.0)
Hemoglobin: 14.1 g/dL (ref 11.7–15.5)
Lymphs Abs: 2227 cells/uL (ref 850–3900)
MCH: 27.8 pg (ref 27.0–33.0)
MCHC: 32.9 g/dL (ref 32.0–36.0)
MCV: 84.6 fL (ref 80.0–100.0)
MPV: 11.5 fL (ref 7.5–12.5)
Monocytes Relative: 6.2 %
Neutro Abs: 3325 cells/uL (ref 1500–7800)
Neutrophils Relative %: 54.5 %
Platelets: 293 10*3/uL (ref 140–400)
RBC: 5.07 10*6/uL (ref 3.80–5.10)
RDW: 13.2 % (ref 11.0–15.0)
Total Lymphocyte: 36.5 %
WBC: 6.1 10*3/uL (ref 3.8–10.8)

## 2020-07-05 LAB — COMPLETE METABOLIC PANEL WITH GFR
AG Ratio: 1.4 (calc) (ref 1.0–2.5)
ALT: 22 U/L (ref 6–29)
AST: 21 U/L (ref 10–35)
Albumin: 4.5 g/dL (ref 3.6–5.1)
Alkaline phosphatase (APISO): 65 U/L (ref 37–153)
BUN: 11 mg/dL (ref 7–25)
CO2: 26 mmol/L (ref 20–32)
Calcium: 10.1 mg/dL (ref 8.6–10.4)
Chloride: 101 mmol/L (ref 98–110)
Creat: 0.99 mg/dL (ref 0.50–0.99)
GFR, Est African American: 70 mL/min/{1.73_m2} (ref >=60)
GFR, Est Non African American: 60 mL/min/{1.73_m2} (ref >=60)
Globulin: 3.3 g/dL (calc) (ref 1.9–3.7)
Glucose, Bld: 109 mg/dL — ABNORMAL HIGH (ref 65–99)
Potassium: 3.4 mmol/L — ABNORMAL LOW (ref 3.5–5.3)
Sodium: 141 mmol/L (ref 135–146)
Total Bilirubin: 0.6 mg/dL (ref 0.2–1.2)
Total Protein: 7.8 g/dL (ref 6.1–8.1)

## 2020-07-05 LAB — LIPID PANEL
Cholesterol: 203 mg/dL — ABNORMAL HIGH (ref <200)
HDL: 63 mg/dL (ref >=50)
LDL Cholesterol (Calc): 122 mg/dL (calc) — ABNORMAL HIGH
Non-HDL Cholesterol (Calc): 140 mg/dL (calc) — ABNORMAL HIGH (ref <130)
Total CHOL/HDL Ratio: 3.2 (calc) (ref <5.0)
Triglycerides: 81 mg/dL (ref <150)

## 2020-07-05 LAB — TSH+FREE T4: TSH W/REFLEX TO FT4: 0.89 mIU/L (ref 0.40–4.50)

## 2020-07-05 MED ORDER — ATORVASTATIN CALCIUM 10 MG PO TABS: 10.0000 mg | ORAL_TABLET | Freq: Every day | ORAL | 3 refills | Status: DC

## 2020-07-06 ENCOUNTER — Ambulatory Visit: Payer: Self-pay | Admitting: Family Medicine

## 2020-07-11 ENCOUNTER — Telehealth: Payer: Self-pay

## 2020-07-11 NOTE — Telephone Encounter (Signed)
The pt husband was notified of his wife results. He verbalize understanding, no questions or concerns.

## 2020-07-11 NOTE — Telephone Encounter (Signed)
Copied from [place] [id number]. Topic: General - Other >> Jul 11, 2020  1:42 PM Yvette Rack wrote: Reason for CRM: Pt husband Oti stated pt received a call to go over most recent lab results but pt did not fully understand what was explained. Pt husband requests call back. Cb# [phone]

## 2021-01-01 ENCOUNTER — Other Ambulatory Visit: Payer: Self-pay

## 2021-01-01 ENCOUNTER — Encounter: Payer: Self-pay | Admitting: Internal Medicine

## 2021-01-01 ENCOUNTER — Ambulatory Visit: Payer: 59 | Admitting: Internal Medicine

## 2021-01-01 VITALS — BP 109/76 | HR 78 | Temp 97.1°F | Wt 186.0 lb

## 2021-01-01 DIAGNOSIS — M85852 Other specified disorders of bone density and structure, left thigh: Secondary | ICD-10-CM

## 2021-01-01 DIAGNOSIS — E7800 Pure hypercholesterolemia, unspecified: Secondary | ICD-10-CM | POA: Diagnosis not present

## 2021-01-01 DIAGNOSIS — I10 Essential (primary) hypertension: Secondary | ICD-10-CM | POA: Diagnosis not present

## 2021-01-01 DIAGNOSIS — E1169 Type 2 diabetes mellitus with other specified complication: Secondary | ICD-10-CM | POA: Insufficient documentation

## 2021-01-01 DIAGNOSIS — R7303 Prediabetes: Secondary | ICD-10-CM

## 2021-01-01 NOTE — Patient Instructions (Signed)

## 2021-01-01 NOTE — Assessment & Plan Note (Signed)
Controlled on Amlodipine and HCTZ Reinforced DASH diet and exercise for weight loss CMET today

## 2021-01-01 NOTE — Assessment & Plan Note (Signed)
A1C today Encouraged her to consume a low carb diet and exercise for weight loss Will monitor

## 2021-01-01 NOTE — Assessment & Plan Note (Signed)
CMET and lipid profile today Encouraged her to consume a low fat diet Not taking statin as prescribed

## 2021-01-01 NOTE — Assessment & Plan Note (Signed)
Continue Calcium and Vit D Encouraged weight bearing exercise daily

## 2021-01-01 NOTE — Progress Notes (Signed)
Subjective:    Patient ID: Linda Guzman, female    DOB: Sep 23, 1955, 65 y.o.   MRN: [id number]  HPI  Patient presents the clinic today for her follow-up of chronic conditions.  She is establishing care with me today, transferring care from Lonie Peak, NP.  HTN: Her BP today is 109/76.  She is taking Amlodipine and HCTZ as prescribed.  ECG from 07/2018 reviewed.  HLD: Her last LDL was 122, triglycerides 81, 06/2020.  She is not taking Atorvastatin as prescribed. She tries to consume a low-fat diet.  Prediabetes: Her last A1c was 6.3%, 06/2020.  She is not taking any oral diabetic medication at this time.  She does not check her sugars.  Osteopenia: Bone density from 12020 reviewed. She is taking Calcium and Vit D OTC. She gets weight bearing exercise daily.  Review of Systems      Past Medical History:  Diagnosis Date  . Hypertension     Current Outpatient Medications  Medication Sig Dispense Refill  . amLODipine (NORVASC) 10 MG tablet Take 1 tablet (10 mg total) by mouth daily. 90 tablet 3  . atorvastatin (LIPITOR) 10 MG tablet Take 1 tablet (10 mg total) by mouth daily. 90 tablet 3  . baclofen (LIORESAL) 10 MG tablet TAKE 1 TABLET(10 MG) BY MOUTH THREE TIMES DAILY 90 tablet 0  . cholecalciferol (VITAMIN D) 1000 units tablet Take 1,000 Units by mouth daily.    . hydrochlorothiazide (HYDRODIURIL) 50 MG tablet Take 1 tablet (50 mg total) by mouth daily. 90 tablet 3  . ibuprofen (ADVIL) 600 MG tablet TAKE 1 TABLET(600 MG) BY MOUTH EVERY 6 HOURS AS NEEDED 120 tablet 0  . Magnesium Oxide 250 MG TABS Take 250 mg by mouth daily.      No current facility-administered medications for this visit.    Allergies  Allergen Reactions  . Chloroquine Itching  . Propofol     Patient had prolonged fatigue, weakness several weeks after propofol use for colonoscopy 2020. - Patient is not truly allergic, but this med should be used with caution in future    Family History  Problem Relation  Age of Onset  . Hypertension Mother   . Stroke Sister   . Breast cancer Neg Hx     Social History   Socioeconomic History  . Marital status: Married    Spouse name: Not on file  . Number of children: Not on file  . Years of education: Not on file  . Highest education level: Not on file  Occupational History  . Not on file  Tobacco Use  . Smoking status: Never Smoker  . Smokeless tobacco: Never Used  Vaping Use  . Vaping Use: Never used  Substance and Sexual Activity  . Alcohol use: No  . Drug use: No  . Sexual activity: Yes    Birth control/protection: Post-menopausal  Other Topics Concern  . Not on file  Social History Narrative  . Not on file   Social Determinants of Health   Financial Resource Strain: Not on file  Food Insecurity: Not on file  Transportation Needs: Not on file  Physical Activity: Not on file  Stress: Not on file  Social Connections: Not on file  Intimate Partner Violence: Not on file     Constitutional: Denies fever, malaise, fatigue, headache or abrupt weight changes.  HEENT: Denies eye pain, eye redness, ear pain, ringing in the ears, wax buildup, runny nose, nasal congestion, bloody nose, or sore throat. Respiratory: Denies difficulty  breathing, shortness of breath, cough or sputum production.   Cardiovascular: Denies chest pain, chest tightness, palpitations or swelling in the hands or feet.  Musculoskeletal: Denies decrease in range of motion, difficulty with gait, muscle pain or joint pain and swelling.  Skin: Denies redness, rashes, lesions or ulcercations.  Neurological: Denies dizziness, difficulty with memory, difficulty with speech or problems with balance and coordination.    No other specific complaints in a complete review of systems (except as listed in HPI above).  Objective:   Physical Exam   BP 109/76 (BP Location: Right Arm, Patient Position: Sitting, Cuff Size: Large)   Pulse 78   Temp (!) 97.1 F (36.2 C) (Temporal)    Wt 186 lb (84.4 kg)   SpO2 100%   BMI 28.28 kg/m   Wt Readings from Last 3 Encounters:  07/04/20 187 lb 3.2 oz (84.9 kg)  01/04/20 186 lb 9.6 oz (84.6 kg)  04/16/19 182 lb 12.8 oz (82.9 kg)    General: Appears her stated age, well developed, well nourished in NAD. Skin: Warm, dry and intact. No rashes or ulcerations noted. HEENT: Head: normal shape and size; Eyes: sclera white and EOMs intact;  Cardiovascular: Normal rate and rhythm. S1,S2 noted.  No murmur, rubs or gallops noted. No JVD or BLE edema. No carotid bruits noted. Pulmonary/Chest: Normal effort and positive vesicular breath sounds. No respiratory distress. No wheezes, rales or ronchi noted.  Musculoskeletal:  No difficulty with gait.  Neurological: Alert and oriented.  Psychiatric: Mood and affect normal. Behavior is normal. Judgment and thought content normal.    BMET    Component Value Date/Time   NA 141 07/04/2020 0837   K 3.4 (L) 07/04/2020 0837   CL 101 07/04/2020 0837   CO2 26 07/04/2020 0837   GLUCOSE 109 (H) 07/04/2020 0837   BUN 11 07/04/2020 0837   CREATININE 0.99 07/04/2020 0837   CALCIUM 10.1 07/04/2020 0837   GFRNONAA 60 07/04/2020 0837   GFRAA 70 07/04/2020 0837    Lipid Panel     Component Value Date/Time   CHOL 203 (H) 07/04/2020 0837   TRIG 81 07/04/2020 0837   HDL 63 07/04/2020 0837   CHOLHDL 3.2 07/04/2020 0837   VLDL 20 01/08/2017 0904   LDLCALC 122 (H) 07/04/2020 0837    CBC    Component Value Date/Time   WBC 6.1 07/04/2020 0837   RBC 5.07 07/04/2020 0837   HGB 14.1 07/04/2020 0837   HCT 42.9 07/04/2020 0837   PLT 293 07/04/2020 0837   MCV 84.6 07/04/2020 0837   MCH 27.8 07/04/2020 0837   MCHC 32.9 07/04/2020 0837   RDW 13.2 07/04/2020 0837   LYMPHSABS 2,227 07/04/2020 0837   MONOABS 0.4 07/21/2018 1157   EOSABS 128 07/04/2020 0837   BASOSABS 43 07/04/2020 0837    Hgb A1C Lab Results  Component Value Date   HGBA1C 6.3 (A) 07/04/2020           Assessment &  Plan:    Webb Silversmith, NP This visit occurred during the SARS-CoV-2 public health emergency.  Safety protocols were in place, including screening questions prior to the visit, additional usage of staff PPE, and extensive cleaning of exam room while observing appropriate contact time as indicated for disinfecting solutions.

## 2021-01-02 ENCOUNTER — Other Ambulatory Visit: Payer: Self-pay

## 2021-01-02 DIAGNOSIS — E785 Hyperlipidemia, unspecified: Secondary | ICD-10-CM

## 2021-01-02 LAB — COMPREHENSIVE METABOLIC PANEL
AG Ratio: 1.3 (calc) (ref 1.0–2.5)
ALT: 24 U/L (ref 6–29)
AST: 29 U/L (ref 10–35)
Albumin: 4.3 g/dL (ref 3.6–5.1)
Alkaline phosphatase (APISO): 71 U/L (ref 37–153)
BUN: 11 mg/dL (ref 7–25)
CO2: 28 mmol/L (ref 20–32)
Calcium: 9.9 mg/dL (ref 8.6–10.4)
Chloride: 100 mmol/L (ref 98–110)
Creat: 0.89 mg/dL (ref 0.50–0.99)
Globulin: 3.4 g/dL (calc) (ref 1.9–3.7)
Glucose, Bld: 121 mg/dL — ABNORMAL HIGH (ref 65–99)
Potassium: 3.5 mmol/L (ref 3.5–5.3)
Sodium: 140 mmol/L (ref 135–146)
Total Bilirubin: 0.7 mg/dL (ref 0.2–1.2)
Total Protein: 7.7 g/dL (ref 6.1–8.1)

## 2021-01-02 LAB — LIPID PANEL
Cholesterol: 224 mg/dL — ABNORMAL HIGH (ref <200)
HDL: 59 mg/dL (ref >=50)
LDL Cholesterol (Calc): 146 mg/dL (calc) — ABNORMAL HIGH
Non-HDL Cholesterol (Calc): 165 mg/dL (calc) — ABNORMAL HIGH (ref <130)
Total CHOL/HDL Ratio: 3.8 (calc) (ref <5.0)
Triglycerides: 88 mg/dL (ref <150)

## 2021-01-02 LAB — HEMOGLOBIN A1C
Hgb A1c MFr Bld: 6.4 % of total Hgb — ABNORMAL HIGH (ref <5.7)
Mean Plasma Glucose: 137 mg/dL
eAG (mmol/L): 7.6 mmol/L

## 2021-01-02 MED ORDER — ATORVASTATIN CALCIUM 10 MG PO TABS: 10.0000 mg | ORAL_TABLET | Freq: Every day | ORAL | 3 refills | Status: DC

## 2021-04-11 ENCOUNTER — Ambulatory Visit: Payer: Self-pay | Admitting: Internal Medicine

## 2021-07-03 ENCOUNTER — Encounter: Payer: 59 | Admitting: Internal Medicine

## 2021-07-04 ENCOUNTER — Ambulatory Visit: Payer: Self-pay | Admitting: *Deleted

## 2021-07-04 NOTE — Telephone Encounter (Signed)
Called patient to review symptoms using interpreter [phone]. C/o right great toe callus x 2 months and now very painful to walk . Unable to step on floor on right great toe. Wearing slippers. Wearing shoes is painful . C/o burning sensation at times. Denies fever, redness or swelling. Recommended soaking right foot in warm soapy water, taking tylenol not exceeding 4000 mg in 24 hours and wearing slippers to decrease pain . Care advise given patient verbalized understanding of care advise and to call back . Please advise . Appt already scheduled for 07/10/21.    Reason for Disposition  [1] MODERATE pain (e.g., interferes with normal activities, limping) AND [2] present > 3 days  Answer Assessment - Initial Assessment Questions 1. ONSET: "When did the pain start?"      2 months now  2. LOCATION: "Where is the pain located?"      Right great toe 3. PAIN: "How bad is the pain?"    (Scale 1-10; or mild, moderate, severe)  - MILD (1-3): doesn't interfere with normal activities.   - MODERATE (4-7): interferes with normal activities (e.g., work or school) or awakens from sleep, limping.   - SEVERE (8-10): excruciating pain, unable to do any normal activities, unable to walk.      Difficulty walking with shoe on , difficulty putting weight on toe  4. WORK OR EXERCISE: "Has there been any recent work or exercise that involved this part of the body?"      Na  5. CAUSE: "What do you think is causing the foot pain?"     Callus under right great toe 6. OTHER SYMPTOMS: "Do you have any other symptoms?" (e.g., leg pain, rash, fever, numbness)     Burning sensation at times in right great toe 7. PREGNANCY: "Is there any chance you are pregnant?" "When was your last menstrual period?"     na  Protocols used: Foot Pain-A-AH

## 2021-07-04 NOTE — Telephone Encounter (Signed)
Patient experiencing right foot callus pain, heel is hard. Caller seeking clinical advice prior to appointment on 07/10/2021   Called patient via interpreter [phone] to review symptoms. No answer, unable to leave message , voicemail box is full and unable to leave message.

## 2021-07-09 ENCOUNTER — Encounter: Payer: 59 | Admitting: Internal Medicine

## 2021-07-10 ENCOUNTER — Encounter: Payer: Self-pay | Admitting: Family Medicine

## 2021-07-10 ENCOUNTER — Other Ambulatory Visit: Payer: Self-pay

## 2021-07-10 ENCOUNTER — Ambulatory Visit: Payer: 59 | Admitting: Family Medicine

## 2021-07-10 VITALS — BP 116/73 | HR 71 | Ht 68.0 in | Wt 180.4 lb

## 2021-07-10 DIAGNOSIS — L84 Corns and callosities: Secondary | ICD-10-CM | POA: Diagnosis not present

## 2021-07-10 DIAGNOSIS — Z23 Encounter for immunization: Secondary | ICD-10-CM | POA: Diagnosis not present

## 2021-07-10 DIAGNOSIS — M79671 Pain in right foot: Secondary | ICD-10-CM

## 2021-07-10 DIAGNOSIS — L918 Other hypertrophic disorders of the skin: Secondary | ICD-10-CM

## 2021-07-10 DIAGNOSIS — L821 Other seborrheic keratosis: Secondary | ICD-10-CM

## 2021-07-10 NOTE — Telephone Encounter (Signed)
Pt has been seen in office by Dr. Raliegh Ip

## 2021-07-10 NOTE — Patient Instructions (Addendum)
Thank you for coming to the office today.  Flu Shot today  If you don't hear back with an appointment within 2-3 weeks, please give them a call to schedule.  Dermatology  [facility]   [place], [place] [address] Hours: 8AM-5PM Phone: [phone]  -----  [facility] Address: [address], [place], [place] [address] Hours: Open 8AM-5PM Phone: [phone]    Please schedule a Follow-up Appointment to: Return if symptoms worsen or fail to improve.  If you have any other questions or concerns, please feel free to call the office or send a message through [place]. You may also schedule an earlier appointment if necessary.  Additionally, you may be receiving a survey about your experience at our office within a few days to 1 week by e-mail or mail. We value your feedback.  Nobie Putnam, DO [place]

## 2021-07-10 NOTE — Progress Notes (Signed)
Subjective:    Patient ID: Linda Guzman, female    DOB: 11/21/1955, 65 y.o.   MRN: [id number]  Linda Guzman is a 65 y.o. female presenting on 07/10/2021 for Foot Pain   HPI  Right Foot Pain, Callus Reports chronic >1 year callus R forefoot, they have tried emory board/pumice stone chipping away at callus and foot soaks. Some improvement but central thick core of callus still there causing her pain. Not seen Podiatry  Multiple Skin Tags / SKs on face / neck She is requesting removal of several larger skin tags that are bothersome on face and neck. She requests dermatology   Health Maintenance: Due for Flu Shot, will receive today    Depression screen [facility] 2/9 01/01/2021 07/04/2020 04/16/2019  Decreased Interest 0 0 0  Down, Depressed, Hopeless 0 0 0  PHQ - 2 Score 0 0 0  Altered sleeping 0 - -  Tired, decreased energy 0 - -  Change in appetite 0 - -  Feeling bad or failure about yourself  0 - -  Trouble concentrating 0 - -  Moving slowly or fidgety/restless 0 - -  Suicidal thoughts 0 - -  PHQ-9 Score 0 - -  Difficult doing work/chores Not difficult at all - -    Social History   Tobacco Use   Smoking status: Never   Smokeless tobacco: Never  Vaping Use   Vaping Use: Never used  Substance Use Topics   Alcohol use: No   Drug use: No    Review of Systems Per HPI unless specifically indicated above     Objective:    BP 116/73   Pulse 71   Ht 5\' 8"  (1.727 m)   Wt 180 lb 6.4 oz (81.8 kg)   SpO2 100%   BMI 27.43 kg/m   Wt Readings from Last 3 Encounters:  07/10/21 180 lb 6.4 oz (81.8 kg)  01/01/21 186 lb (84.4 kg)  07/04/20 187 lb 3.2 oz (84.9 kg)    Physical Exam Vitals and nursing note reviewed.  Constitutional:      General: She is not in acute distress.    Appearance: Normal appearance. She is well-developed. She is not diaphoretic.     Comments: Well-appearing, comfortable, cooperative  HENT:     Head: Normocephalic and atraumatic.  Eyes:      General:        Right eye: No discharge.        Left eye: No discharge.     Conjunctiva/sclera: Conjunctivae normal.  Cardiovascular:     Rate and Rhythm: Normal rate.  Pulmonary:     Effort: Pulmonary effort is normal.  Skin:    General: Skin is warm and dry.     Findings: No erythema or rash.     Comments: R Foot, callus, large firm forefoot lateral. Non tender. No ulceration  Face and neck extensive pigmented skin tags various sizes shape, and SKs  Neurological:     Mental Status: She is alert and oriented to person, place, and time.  Psychiatric:        Mood and Affect: Mood normal.        Behavior: Behavior normal.        Thought Content: Thought content normal.     Comments: Well groomed, good eye contact, normal speech and thoughts      Results for orders placed or performed in visit on 01/01/21  Comprehensive metabolic panel  Result Value Ref Range   Glucose, Bld 121 (  H) 65 - 99 mg/dL   BUN 11 7 - 25 mg/dL   Creat 0.89 0.50 - 0.99 mg/dL   BUN/Creatinine Ratio NOT APPLICABLE 6 - 22 (calc)   Sodium 140 135 - 146 mmol/L   Potassium 3.5 3.5 - 5.3 mmol/L   Chloride 100 98 - 110 mmol/L   CO2 28 20 - 32 mmol/L   Calcium 9.9 8.6 - 10.4 mg/dL   Total Protein 7.7 6.1 - 8.1 g/dL   Albumin 4.3 3.6 - 5.1 g/dL   Globulin 3.4 1.9 - 3.7 g/dL (calc)   AG Ratio 1.3 1.0 - 2.5 (calc)   Total Bilirubin 0.7 0.2 - 1.2 mg/dL   Alkaline phosphatase (APISO) 71 37 - 153 U/L   AST 29 10 - 35 U/L   ALT 24 6 - 29 U/L  Lipid panel  Result Value Ref Range   Cholesterol 224 (H) <200 mg/dL   HDL 59 > OR = 50 mg/dL   Triglycerides 88 <150 mg/dL   LDL Cholesterol (Calc) 146 (H) mg/dL (calc)   Total CHOL/HDL Ratio 3.8 <5.0 (calc)   Non-HDL Cholesterol (Calc) 165 (H) <130 mg/dL (calc)  Hemoglobin A1c  Result Value Ref Range   Hgb A1c MFr Bld 6.4 (H) <5.7 % of total Hgb   Mean Plasma Glucose 137 mg/dL   eAG (mmol/L) 7.6 mmol/L      Assessment & Plan:   Problem List Items Addressed This  Visit   None Visit Diagnoses     Callus of foot    -  Primary   Relevant Orders   Ambulatory referral to Podiatry   Needs flu shot       Relevant Orders   Flu Vaccine QUAD High Dose(Fluad) (Completed)   Skin tag       Relevant Orders   Ambulatory referral to Dermatology   SK (seborrheic keratosis)       Relevant Orders   Ambulatory referral to Dermatology   Right foot pain       Relevant Orders   Ambulatory referral to Podiatry       Referral to Podiatry for large R foot outer forefoot callus with central core thickened causing pain and difficulty ambulating.   Referral to Dermatology for skin tag / SK removal   Orders Placed This Encounter  Procedures   Flu Vaccine QUAD High Dose(Fluad)   Ambulatory referral to Podiatry    Referral Priority:   Routine    Referral Type:   Consultation    Referral Reason:   Specialty Services Required    Requested Specialty:   Podiatry    Number of Visits Requested:   1   Ambulatory referral to Dermatology    Referral Priority:   Routine    Referral Type:   Consultation    Referral Reason:   Specialty Services Required    Requested Specialty:   Dermatology    Number of Visits Requested:   1     No orders of the defined types were placed in this encounter.    Follow up plan: Return if symptoms worsen or fail to improve.   Nobie Putnam, [facility] 07/10/2021, 9:16 AM

## 2021-07-19 ENCOUNTER — Other Ambulatory Visit: Payer: Self-pay

## 2021-07-19 ENCOUNTER — Ambulatory Visit (INDEPENDENT_AMBULATORY_CARE_PROVIDER_SITE_OTHER): Payer: 59 | Admitting: Podiatry

## 2021-07-19 DIAGNOSIS — L989 Disorder of the skin and subcutaneous tissue, unspecified: Secondary | ICD-10-CM

## 2021-07-19 DIAGNOSIS — Q828 Other specified congenital malformations of skin: Secondary | ICD-10-CM

## 2021-07-24 NOTE — Progress Notes (Signed)
  Subjective:  Patient ID: Linda Guzman, female    DOB: [birth year],  MRN: [id number]  No chief complaint on file.   65 y.o. female presents with the above complaint.  Patient presents with complaint of right submetatarsal 5 hyperkeratotic lesion with central nucleated core/benign skin lesion.  Patient states is painful to touch painful to walk on.  She states it hurts with every step.  Is been going on for about a year has progressed to gotten worse.  She denies any other acute complaint she has not seen anyone else prior to seeing me.  She has not seen a foot and ankle specialist.   Review of Systems: Negative except as noted in the HPI. Denies N/V/F/Ch.  Past Medical History:  Diagnosis Date   Hypertension     Current Outpatient Medications:    amLODipine (NORVASC) 10 MG tablet, Take 1 tablet (10 mg total) by mouth daily., Disp: 90 tablet, Rfl: 3   atorvastatin (LIPITOR) 10 MG tablet, Take 1 tablet (10 mg total) by mouth daily., Disp: 90 tablet, Rfl: 3   baclofen (LIORESAL) 10 MG tablet, TAKE 1 TABLET(10 MG) BY MOUTH THREE TIMES DAILY (Patient not taking: Reported on 01/01/2021), Disp: 90 tablet, Rfl: 0   cholecalciferol (VITAMIN D) 1000 units tablet, Take 1,000 Units by mouth daily., Disp: , Rfl:    hydrochlorothiazide (HYDRODIURIL) 50 MG tablet, Take 1 tablet (50 mg total) by mouth daily., Disp: 90 tablet, Rfl: 3   ibuprofen (ADVIL) 600 MG tablet, TAKE 1 TABLET(600 MG) BY MOUTH EVERY 6 HOURS AS NEEDED, Disp: 120 tablet, Rfl: 0   Magnesium Oxide 250 MG TABS, Take 250 mg by mouth daily. , Disp: , Rfl:   Social History   Tobacco Use  Smoking Status Never  Smokeless Tobacco Never    Allergies  Allergen Reactions   Chloroquine Itching   Propofol     Patient had prolonged fatigue, weakness several weeks after propofol use for colonoscopy 2020. - Patient is not truly allergic, but this med should be used with caution in future   Objective:  There were no vitals filed for this  visit. There is no height or weight on file to calculate BMI. Constitutional Well developed. Well nourished.  Vascular Dorsalis pedis pulses palpable bilaterally. Posterior tibial pulses palpable bilaterally. Capillary refill normal to all digits.  No cyanosis or clubbing noted. Pedal hair growth normal.  Neurologic Normal speech. Oriented to person, place, and time. Epicritic sensation to light touch grossly present bilaterally.  Dermatologic Benign skin lesion/hyperkeratotic lesion noted to right submetatarsal 5.  Central nucleated core noted.  Upon debridement no pinpoint bleeding noted.  Pain on palpation to the lesion  Orthopedic: Normal joint ROM without pain or crepitus bilaterally. No visible deformities. No bony tenderness.   Radiographs: None Assessment:   1. Porokeratosis   2. Benign skin lesion    Plan:  Patient was evaluated and treated and all questions answered.  Right submetatarsal 5 benign skin lesion/porokeratosis -I explained to the patient the etiology of porokeratotic lesion and various treatment options were extensively discussed.  Given the amount of pain that she is having I believe she would benefit from debridement of the lesion.  Using chisel blade and handle, the lesion was debrided down to healthy striated tissue.  No complication noted no pinpoint bleeding noted. -If there is no improvement we will discuss floating osteotomy versus surgical excision.  No follow-ups on file.

## 2021-07-25 ENCOUNTER — Encounter: Payer: Self-pay | Admitting: Internal Medicine

## 2021-07-25 ENCOUNTER — Ambulatory Visit (INDEPENDENT_AMBULATORY_CARE_PROVIDER_SITE_OTHER): Payer: 59 | Admitting: Internal Medicine

## 2021-07-25 ENCOUNTER — Other Ambulatory Visit: Payer: Self-pay

## 2021-07-25 VITALS — BP 122/86 | HR 78 | Temp 98.5°F | Resp 17 | Ht 68.0 in | Wt 183.0 lb

## 2021-07-25 DIAGNOSIS — E6609 Other obesity due to excess calories: Secondary | ICD-10-CM | POA: Insufficient documentation

## 2021-07-25 DIAGNOSIS — E663 Overweight: Secondary | ICD-10-CM | POA: Insufficient documentation

## 2021-07-25 DIAGNOSIS — Z0001 Encounter for general adult medical examination with abnormal findings: Secondary | ICD-10-CM | POA: Diagnosis not present

## 2021-07-25 DIAGNOSIS — Z6829 Body mass index (BMI) 29.0-29.9, adult: Secondary | ICD-10-CM | POA: Insufficient documentation

## 2021-07-25 DIAGNOSIS — Z6828 Body mass index (BMI) 28.0-28.9, adult: Secondary | ICD-10-CM | POA: Insufficient documentation

## 2021-07-25 DIAGNOSIS — R7303 Prediabetes: Secondary | ICD-10-CM

## 2021-07-25 DIAGNOSIS — Z23 Encounter for immunization: Secondary | ICD-10-CM

## 2021-07-25 DIAGNOSIS — Z6827 Body mass index (BMI) 27.0-27.9, adult: Secondary | ICD-10-CM | POA: Insufficient documentation

## 2021-07-25 DIAGNOSIS — Z1231 Encounter for screening mammogram for malignant neoplasm of breast: Secondary | ICD-10-CM

## 2021-07-25 NOTE — Patient Instructions (Signed)
Health Maintenance for Postmenopausal Women ?Menopause is a normal process in which your ability to get pregnant comes to an end. This process happens slowly over many months or years, usually between the ages of 48 and 55. Menopause is complete when you have missed your menstrual period for 12 months. ?It is important to talk with your health care provider about some of the most common conditions that affect women after menopause (postmenopausal women). These include heart disease, cancer, and bone loss (osteoporosis). Adopting a healthy lifestyle and getting preventive care can help to promote your health and wellness. The actions you take can also lower your chances of developing some of these common conditions. ?What are the signs and symptoms of menopause? ?During menopause, you may have the following symptoms: ?Hot flashes. These can be moderate or severe. ?Night sweats. ?Decrease in sex drive. ?Mood swings. ?Headaches. ?Tiredness (fatigue). ?Irritability. ?Memory problems. ?Problems falling asleep or staying asleep. ?Talk with your health care provider about treatment options for your symptoms. ?Do I need hormone replacement therapy? ?Hormone replacement therapy is effective in treating symptoms that are caused by menopause, such as hot flashes and night sweats. ?Hormone replacement carries certain risks, especially as you become older. If you are thinking about using estrogen or estrogen with progestin, discuss the benefits and risks with your health care provider. ?How can I reduce my risk for heart disease and stroke? ?The risk of heart disease, heart attack, and stroke increases as you age. One of the causes may be a change in the body's hormones during menopause. This can affect how your body uses dietary fats, triglycerides, and cholesterol. Heart attack and stroke are medical emergencies. There are many things that you can do to help prevent heart disease and stroke. ?Watch your blood pressure ?High  blood pressure causes heart disease and increases the risk of stroke. This is more likely to develop in people who have high blood pressure readings or are overweight. ?Have your blood pressure checked: ?Every 3-5 years if you are 18-39 years of age. ?Every year if you are 40 years old or older. ?Eat a healthy diet ? ?Eat a diet that includes plenty of vegetables, fruits, low-fat dairy products, and lean protein. ?Do not eat a lot of foods that are high in solid fats, added sugars, or sodium. ?Get regular exercise ?Get regular exercise. This is one of the most important things you can do for your health. Most adults should: ?Try to exercise for at least 150 minutes each week. The exercise should increase your heart rate and make you sweat (moderate-intensity exercise). ?Try to do strengthening exercises at least twice each week. Do these in addition to the moderate-intensity exercise. ?Spend less time sitting. Even light physical activity can be beneficial. ?Other tips ?Work with your health care provider to achieve or maintain a healthy weight. ?Do not use any products that contain nicotine or tobacco. These products include cigarettes, chewing tobacco, and vaping devices, such as e-cigarettes. If you need help quitting, ask your health care provider. ?Know your numbers. Ask your health care provider to check your cholesterol and your blood sugar (glucose). Continue to have your blood tested as directed by your health care provider. ?Do I need screening for cancer? ?Depending on your health history and family history, you may need to have cancer screenings at different stages of your life. This may include screening for: ?Breast cancer. ?Cervical cancer. ?Lung cancer. ?Colorectal cancer. ?What is my risk for osteoporosis? ?After menopause, you may be   at increased risk for osteoporosis. Osteoporosis is a condition in which bone destruction happens more quickly than new bone creation. To help prevent osteoporosis or  the bone fractures that can happen because of osteoporosis, you may take the following actions: ?If you are 19-50 years old, get at least 1,000 mg of calcium and at least 600 international units (IU) of vitamin D per day. ?If you are older than age 50 but younger than age 70, get at least 1,200 mg of calcium and at least 600 international units (IU) of vitamin D per day. ?If you are older than age 70, get at least 1,200 mg of calcium and at least 800 international units (IU) of vitamin D per day. ?Smoking and drinking excessive alcohol increase the risk of osteoporosis. Eat foods that are rich in calcium and vitamin D, and do weight-bearing exercises several times each week as directed by your health care provider. ?How does menopause affect my mental health? ?Depression may occur at any age, but it is more common as you become older. Common symptoms of depression include: ?Feeling depressed. ?Changes in sleep patterns. ?Changes in appetite or eating patterns. ?Feeling an overall lack of motivation or enjoyment of activities that you previously enjoyed. ?Frequent crying spells. ?Talk with your health care provider if you think that you are experiencing any of these symptoms. ?General instructions ?See your health care provider for regular wellness exams and vaccines. This may include: ?Scheduling regular health, dental, and eye exams. ?Getting and maintaining your vaccines. These include: ?Influenza vaccine. Get this vaccine each year before the flu season begins. ?Pneumonia vaccine. ?Shingles vaccine. ?Tetanus, diphtheria, and pertussis (Tdap) booster vaccine. ?Your health care provider may also recommend other immunizations. ?Tell your health care provider if you have ever been abused or do not feel safe at home. ?Summary ?Menopause is a normal process in which your ability to get pregnant comes to an end. ?This condition causes hot flashes, night sweats, decreased interest in sex, mood swings, headaches, or lack  of sleep. ?Treatment for this condition may include hormone replacement therapy. ?Take actions to keep yourself healthy, including exercising regularly, eating a healthy diet, watching your weight, and checking your blood pressure and blood sugar levels. ?Get screened for cancer and depression. Make sure that you are up to date with all your vaccines. ?This information is not intended to replace advice given to you by your health care provider. Make sure you discuss any questions you have with your health care provider. ?Document Revised: 12/25/2020 Document Reviewed: 12/25/2020 ?Elsevier Patient Education ? 2022 Elsevier Inc. ? ?

## 2021-07-25 NOTE — Assessment & Plan Note (Signed)
Encourage diet and exercise for weight loss 

## 2021-07-25 NOTE — Progress Notes (Signed)
Subjective:    Patient ID: Linda Guzman, female    DOB: 04/11/1956, 65 y.o.   MRN: [id number]  HPI  Pt presents to the clinic today for her annual exam.  Flu: 06/2021 Tetanus: 03/2018 Pneumovax: Never Prevnar: Never Shingrix: Never Pap Smear: 03/2018 Mammogram: 08/2018 Bone Density: 08/2018 Colon Screening: 10/2018 Vision Screening: As needed Dentist: As needed   Review of Systems     Past Medical History:  Diagnosis Date   Hypertension     Current Outpatient Medications  Medication Sig Dispense Refill   amLODipine (NORVASC) 10 MG tablet Take 1 tablet (10 mg total) by mouth daily. 90 tablet 3   atorvastatin (LIPITOR) 10 MG tablet Take 1 tablet (10 mg total) by mouth daily. 90 tablet 3   baclofen (LIORESAL) 10 MG tablet TAKE 1 TABLET(10 MG) BY MOUTH THREE TIMES DAILY (Patient not taking: Reported on 01/01/2021) 90 tablet 0   cholecalciferol (VITAMIN D) 1000 units tablet Take 1,000 Units by mouth daily.     hydrochlorothiazide (HYDRODIURIL) 50 MG tablet Take 1 tablet (50 mg total) by mouth daily. 90 tablet 3   ibuprofen (ADVIL) 600 MG tablet TAKE 1 TABLET(600 MG) BY MOUTH EVERY 6 HOURS AS NEEDED 120 tablet 0   Magnesium Oxide 250 MG TABS Take 250 mg by mouth daily.      No current facility-administered medications for this visit.    Allergies  Allergen Reactions   Chloroquine Itching   Propofol     Patient had prolonged fatigue, weakness several weeks after propofol use for colonoscopy 2020. - Patient is not truly allergic, but this med should be used with caution in future    Family History  Problem Relation Age of Onset   Hypertension Mother    Stroke Sister    Breast cancer Neg Hx     Social History   Socioeconomic History   Marital status: Married    Spouse name: Not on file   Number of children: Not on file   Years of education: Not on file   Highest education level: Not on file  Occupational History   Not on file  Tobacco Use   Smoking status: Never    Smokeless tobacco: Never  Vaping Use   Vaping Use: Never used  Substance and Sexual Activity   Alcohol use: No   Drug use: No   Sexual activity: Yes    Birth control/protection: Post-menopausal  Other Topics Concern   Not on file  Social History Narrative   Not on file   Social Determinants of Health   Financial Resource Strain: Not on file  Food Insecurity: Not on file  Transportation Needs: Not on file  Physical Activity: Not on file  Stress: Not on file  Social Connections: Not on file  Intimate Partner Violence: Not on file     Constitutional: Denies fever, malaise, fatigue, headache or abrupt weight changes.  HEENT: Denies eye pain, eye redness, ear pain, ringing in the ears, wax buildup, runny nose, nasal congestion, bloody nose, or sore throat. Respiratory: Denies difficulty breathing, shortness of breath, cough or sputum production.   Cardiovascular: Denies chest pain, chest tightness, palpitations or swelling in the hands or feet.  Gastrointestinal: Denies abdominal pain, bloating, constipation, diarrhea or blood in the stool.  GU: Denies urgency, frequency, pain with urination, burning sensation, blood in urine, odor or discharge. Musculoskeletal: Denies decrease in range of motion, difficulty with gait, muscle pain or joint pain and swelling.  Skin: Patient reports a callus  of her right foot.  Denies redness, rashes, or ulcercations.  Neurological: Denies dizziness, difficulty with memory, difficulty with speech or problems with balance and coordination.  Psych: Denies anxiety, depression, SI/HI.  No other specific complaints in a complete review of systems (except as listed in HPI above).  Objective:   Physical Exam  BP 122/86 (BP Location: Right Arm, Patient Position: Sitting, Cuff Size: Normal)   Pulse 78   Temp 98.5 F (36.9 C) (Temporal)   Resp 17   Ht '5\' 8"'  (1.727 m)   Wt 183 lb (83 kg)   SpO2 100%   BMI 27.83 kg/m   Wt Readings from Last 3  Encounters:  07/10/21 180 lb 6.4 oz (81.8 kg)  01/01/21 186 lb (84.4 kg)  07/04/20 187 lb 3.2 oz (84.9 kg)    General: Appears her stated age, obese, in NAD. Skin: Warm, dry and intact.  HEENT: Head: normal shape and size; Eyes: EOMs intact;  Neck:  Neck supple, trachea midline. No masses, lumps or thyromegaly present.  Cardiovascular: Normal rate and rhythm. S1,S2 noted.  No murmur, rubs or gallops noted. No JVD or BLE edema. No carotid bruits noted. Pulmonary/Chest: Normal effort and positive vesicular breath sounds. No respiratory distress. No wheezes, rales or ronchi noted.  Abdomen: Soft and nontender.  Musculoskeletal: Strength 5/5 BUE/BLE.  No difficulty with gait.  Neurological: Alert and oriented. Cranial nerves II-XII grossly intact. Coordination normal.  Psychiatric: Mood and affect normal. Behavior is normal. Judgment and thought content normal.     BMET    Component Value Date/Time   NA 140 01/01/2021 0822   K 3.5 01/01/2021 0822   CL 100 01/01/2021 0822   CO2 28 01/01/2021 0822   GLUCOSE 121 (H) 01/01/2021 0822   BUN 11 01/01/2021 0822   CREATININE 0.89 01/01/2021 0822   CALCIUM 9.9 01/01/2021 0822   GFRNONAA 60 07/04/2020 0837   GFRAA 70 07/04/2020 0837    Lipid Panel     Component Value Date/Time   CHOL 224 (H) 01/01/2021 0822   TRIG 88 01/01/2021 0822   HDL 59 01/01/2021 0822   CHOLHDL 3.8 01/01/2021 0822   VLDL 20 01/08/2017 0904   LDLCALC 146 (H) 01/01/2021 0822    CBC    Component Value Date/Time   WBC 6.1 07/04/2020 0837   RBC 5.07 07/04/2020 0837   HGB 14.1 07/04/2020 0837   HCT 42.9 07/04/2020 0837   PLT 293 07/04/2020 0837   MCV 84.6 07/04/2020 0837   MCH 27.8 07/04/2020 0837   MCHC 32.9 07/04/2020 0837   RDW 13.2 07/04/2020 0837   LYMPHSABS 2,227 07/04/2020 0837   MONOABS 0.4 07/21/2018 1157   EOSABS 128 07/04/2020 0837   BASOSABS 43 07/04/2020 0837    Hgb A1C Lab Results  Component Value Date   HGBA1C 6.4 (H) 01/01/2021             Assessment & Plan:   Preventative Health Maintenance:  Flu shot UTD Tdap today -patient did not think it had been within the last 10 years and the computer was down so we were unable to verify her last date Prevnar 73 today Will get Pneumovax in 1 year Discussed Shingrix vaccine, she will check coverage with her insurance company Encouraged her to get her COVID-vaccine Pap smear UTD Mammogram ordered-she will call to schedule Bone density UTD Colon screening UTD Encouraged her to consume a balanced diet and exercise regimen Advised her to see an eye doctor and dentist annually We  will check CBC, c-Met, lipid, A1c today  RTC in 6 months, follow-up chronic conditions  Webb Silversmith, NP This visit occurred during the SARS-CoV-2 public health emergency.  Safety protocols were in place, including screening questions prior to the visit, additional usage of staff PPE, and extensive cleaning of exam room while observing appropriate contact time as indicated for disinfecting solutions.

## 2021-08-01 ENCOUNTER — Other Ambulatory Visit: Payer: Self-pay

## 2021-08-01 DIAGNOSIS — I10 Essential (primary) hypertension: Secondary | ICD-10-CM

## 2021-08-01 DIAGNOSIS — E785 Hyperlipidemia, unspecified: Secondary | ICD-10-CM

## 2021-08-01 MED ORDER — AMLODIPINE BESYLATE 10 MG PO TABS: 10.0000 mg | ORAL_TABLET | Freq: Every day | ORAL | 3 refills | Status: DC

## 2021-08-01 MED ORDER — HYDROCHLOROTHIAZIDE 50 MG PO TABS: 50.0000 mg | ORAL_TABLET | Freq: Every day | ORAL | 3 refills | Status: DC

## 2021-08-01 MED ORDER — ATORVASTATIN CALCIUM 10 MG PO TABS: 10.0000 mg | ORAL_TABLET | Freq: Every day | ORAL | 3 refills | Status: DC

## 2021-09-18 ENCOUNTER — Ambulatory Visit: Payer: Self-pay | Admitting: Podiatry

## 2021-11-13 ENCOUNTER — Telehealth: Payer: Self-pay | Admitting: Internal Medicine

## 2021-11-13 NOTE — Telephone Encounter (Signed)
Medication Refill - Medication: hydrochlorothiazide (HYDRODIURIL) 50 MG tablet ? ?amLODipine (NORVASC) 10 MG tablet ? ?Has the patient contacted their pharmacy? Yes.   ?(Agent: If no, request that the patient contact the pharmacy for the refill. If patient does not wish to contact the pharmacy document the reason why and proceed with request.) ?(Agent: If yes, when and what did the pharmacy advise?) contact PCP office  ? ?Preferred Pharmacy (with phone number or street name): [facility] [place], [address]  ?[place], [place] [address]  ?Phone:  [phone]  Fax:  [phone] ?Has the patient been seen for an appointment in the last year OR does the patient have an upcoming appointment? Yes.   ? ?Agent: Please be advised that RX refills may take up to 3 business days. We ask that you follow-up with your pharmacy. ?

## 2021-11-13 NOTE — Telephone Encounter (Signed)
[facility] called, long wait time, will try again later.  ?

## 2021-11-13 NOTE — Telephone Encounter (Signed)
[facility] called and spoke to [place], [facility] about the refill(s) Amlodipine and  Hydrochlorothiazide requested. Advised they were sent on 08/01/21 #90/3 refill(s). He says they have them and will refill for the patient to pick up.  ? ?

## 2021-11-13 NOTE — Telephone Encounter (Signed)
[facility] called, long wait time, will try again later.  ?

## 2021-12-10 ENCOUNTER — Other Ambulatory Visit: Payer: Self-pay | Admitting: Family Medicine

## 2021-12-10 DIAGNOSIS — Z298 Encounter for other specified prophylactic measures: Secondary | ICD-10-CM

## 2021-12-10 MED ORDER — DOXYCYCLINE HYCLATE 100 MG PO TABS: ORAL_TABLET | ORAL | 1 refills | Status: DC

## 2022-01-16 ENCOUNTER — Ambulatory Visit: Payer: Self-pay | Admitting: Dermatology

## 2022-02-25 ENCOUNTER — Other Ambulatory Visit: Payer: Self-pay | Admitting: Internal Medicine

## 2022-02-25 ENCOUNTER — Telehealth: Payer: Self-pay | Admitting: Internal Medicine

## 2022-02-25 DIAGNOSIS — I10 Essential (primary) hypertension: Secondary | ICD-10-CM

## 2022-02-25 MED ORDER — HYDROCHLOROTHIAZIDE 50 MG PO TABS: 50.0000 mg | ORAL_TABLET | Freq: Every day | ORAL | 0 refills | Status: DC

## 2022-02-25 MED ORDER — AMLODIPINE BESYLATE 10 MG PO TABS: 10.0000 mg | ORAL_TABLET | Freq: Every day | ORAL | 0 refills | Status: DC

## 2022-02-25 NOTE — Telephone Encounter (Signed)
Pt requesting refill on  amlodipine 10 MG, Hydrochlorothiazide 50 MG called into  [facility] in  [place]

## 2022-02-26 NOTE — Telephone Encounter (Signed)
Duplicate request

## 2022-03-28 ENCOUNTER — Other Ambulatory Visit: Payer: Self-pay | Admitting: Internal Medicine

## 2022-03-28 DIAGNOSIS — I10 Essential (primary) hypertension: Secondary | ICD-10-CM

## 2022-03-28 NOTE — Telephone Encounter (Signed)
Medication Refill - Medication: hydrochlorothiazide (HYDRODIURIL) 50 MG tablet, amLODipine (NORVASC) 10 MG tablet  Has the patient contacted their pharmacy? Yes.    [id number] [address], [address] [place] AT [place] Phone:  [phone]  Fax:  [phone]     Preferred Pharmacy (with phone number or street name):  Has the patient been seen for an appointment in the last year OR does the patient have an upcoming appointment? Yes.

## 2022-03-28 NOTE — Telephone Encounter (Signed)
Requested medication (s) are due for refill today: yes  Requested medication (s) are on the active medication list: yes  Last refill:  Norvasc 02/25/22 #30 with 0 RF, Hydrodiuril 02/25/22 #30 with 0 RF  Future visit scheduled: no, last seen 07/25/21  Notes to clinic:  Failed protocol of labs within 12 months, labs from 01/01/2021, already had a curtesy refill, no upcoming appt scheduled,   please assess.       Requested Prescriptions  Pending Prescriptions Disp Refills   amLODipine (NORVASC) 10 MG tablet 30 tablet 0    Sig: Take 1 tablet (10 mg total) by mouth daily. Please schedule an office before anymore refills.     Cardiovascular: Calcium Channel Blockers 2 Failed - 03/28/2022 12:07 PM      Failed - Valid encounter within last 6 months    Recent Outpatient Visits           8 months ago Encounter for general adult medical examination with abnormal findings   [facility] [place], Coralie Keens, NP   8 months ago Callus of foot   [place], Devonne Doughty, DO   1 year ago Essential hypertension   [facility] [place], Coralie Keens, NP   1 year ago Essential hypertension   [place], FNP   2 years ago Blood glucose elevated   [facility], Lupita Raider, [place]              Passed - Last BP in normal range    BP Readings from Last 1 Encounters:  07/25/21 122/86         Passed - Last Heart Rate in normal range    Pulse Readings from Last 1 Encounters:  07/25/21 78          hydrochlorothiazide (HYDRODIURIL) 50 MG tablet 30 tablet 0    Sig: Take 1 tablet (50 mg total) by mouth daily. Please schedule an office visit before any more refills     Cardiovascular: Diuretics - Thiazide Failed - 03/28/2022 12:07 PM      Failed - Cr in normal range and within 180 days    Creat  Date Value Ref Range Status  01/01/2021 0.89 0.50 - 0.99 mg/dL Final    Comment:    For patients >37  years of age, the reference limit for Creatinine is approximately 13% higher for people identified as African-American. .          Failed - K in normal range and within 180 days    Potassium  Date Value Ref Range Status  01/01/2021 3.5 3.5 - 5.3 mmol/L Final         Failed - Na in normal range and within 180 days    Sodium  Date Value Ref Range Status  01/01/2021 140 135 - 146 mmol/L Final         Failed - Valid encounter within last 6 months    Recent Outpatient Visits           8 months ago Encounter for general adult medical examination with abnormal findings   [facility] [place], Coralie Keens, NP   8 months ago Callus of foot   [place], Devonne Doughty, DO   1 year ago Essential hypertension   [place], Coralie Keens, NP   1 year ago Essential hypertension   [facility],  Lupita Raider, FNP   2 years ago Blood glucose elevated   [employer], [place]              Passed - Last BP in normal range    BP Readings from Last 1 Encounters:  07/25/21 122/86

## 2022-04-24 ENCOUNTER — Other Ambulatory Visit: Payer: Self-pay | Admitting: Internal Medicine

## 2022-04-24 DIAGNOSIS — I10 Essential (primary) hypertension: Secondary | ICD-10-CM

## 2022-04-25 NOTE — Telephone Encounter (Signed)
Requested medication (s) are due for refill today - yes  Requested medication (s) are on the active medication list -yes  Future visit scheduled -yes  Last refill: 02/25/22 #30  Notes to clinic: last RF was courtesy with notes- request sent for PCP review   Requested Prescriptions  Pending Prescriptions Disp Refills   amLODipine (NORVASC) 10 MG tablet [Pharmacy Med Name: AMLODIPINE BESYLATE '10MG'$  TABLETS] 90 tablet     Sig: TAKE 1 TABLET(10 MG) BY MOUTH DAILY     Cardiovascular: Calcium Channel Blockers 2 Failed - 04/24/2022  6:59 AM      Failed - Valid encounter within last 6 months    Recent Outpatient Visits           9 months ago Encounter for general adult medical examination with abnormal findings   [facility] [place], Coralie Keens, NP   9 months ago Callus of foot   [place], Devonne Doughty, DO   1 year ago Essential hypertension   [place], Coralie Keens, NP   1 year ago Essential hypertension   [place], FNP   2 years ago Blood glucose elevated   [place], FNP       Future Appointments             In 3 months [place], Coralie Keens, NP [facility], [place] BP in normal range    BP Readings from Last 1 Encounters:  07/25/21 122/86         Passed - Last Heart Rate in normal range    Pulse Readings from Last 1 Encounters:  07/25/21 78          hydrochlorothiazide (HYDRODIURIL) 50 MG tablet [Pharmacy Med Name: HYDROCHLOROTHIAZIDE '50MG'$  TABLETS] 90 tablet     Sig: TAKE 1 TABLET(50 MG) BY MOUTH DAILY     Cardiovascular: Diuretics - Thiazide Failed - 04/24/2022  6:59 AM      Failed - Cr in normal range and within 180 days    Creat  Date Value Ref Range Status  01/01/2021 0.89 0.50 - 0.99 mg/dL Final    Comment:    For patients >72 years of age, the reference limit for Creatinine is approximately 13%  higher for people identified as African-American. .          Failed - K in normal range and within 180 days    Potassium  Date Value Ref Range Status  01/01/2021 3.5 3.5 - 5.3 mmol/L Final         Failed - Na in normal range and within 180 days    Sodium  Date Value Ref Range Status  01/01/2021 140 135 - 146 mmol/L Final         Failed - Valid encounter within last 6 months    Recent Outpatient Visits           9 months ago Encounter for general adult medical examination with abnormal findings   [facility] [place], Coralie Keens, NP   9 months ago Callus of foot   [facility] Olin Hauser, DO   1 year ago Essential hypertension   [place], Coralie Keens, NP   1 year ago Essential hypertension   [facility], Lupita Raider, [place]  2 years ago Blood glucose elevated   [place], FNP       Future Appointments             In 3 months Baity, Coralie Keens, NP [place] BP in normal range    BP Readings from Last 1 Encounters:  07/25/21 122/86            Requested Prescriptions  Pending Prescriptions Disp Refills   amLODipine (NORVASC) 10 MG tablet [Pharmacy Med Name: AMLODIPINE BESYLATE '10MG'$  TABLETS] 90 tablet     Sig: TAKE 1 TABLET(10 MG) BY MOUTH DAILY     Cardiovascular: Calcium Channel Blockers 2 Failed - 04/24/2022  6:59 AM      Failed - Valid encounter within last 6 months    Recent Outpatient Visits           9 months ago Encounter for general adult medical examination with abnormal findings   [facility] [place], Coralie Keens, NP   9 months ago Callus of foot   [place], Devonne Doughty, DO   1 year ago Essential hypertension   [place], Coralie Keens, NP   1 year ago Essential hypertension   [place], FNP    2 years ago Blood glucose elevated   [place], FNP       Future Appointments             In 3 months Baity, Coralie Keens, NP [facility], [place] BP in normal range    BP Readings from Last 1 Encounters:  07/25/21 122/86         Passed - Last Heart Rate in normal range    Pulse Readings from Last 1 Encounters:  07/25/21 78          hydrochlorothiazide (HYDRODIURIL) 50 MG tablet [Pharmacy Med Name: HYDROCHLOROTHIAZIDE '50MG'$  TABLETS] 90 tablet     Sig: TAKE 1 TABLET(50 MG) BY MOUTH DAILY     Cardiovascular: Diuretics - Thiazide Failed - 04/24/2022  6:59 AM      Failed - Cr in normal range and within 180 days    Creat  Date Value Ref Range Status  01/01/2021 0.89 0.50 - 0.99 mg/dL Final    Comment:    For patients >77 years of age, the reference limit for Creatinine is approximately 13% higher for people identified as African-American. .          Failed - K in normal range and within 180 days    Potassium  Date Value Ref Range Status  01/01/2021 3.5 3.5 - 5.3 mmol/L Final         Failed - Na in normal range and within 180 days    Sodium  Date Value Ref Range Status  01/01/2021 140 135 - 146 mmol/L Final         Failed - Valid encounter within last 6 months    Recent Outpatient Visits           9 months ago Encounter for general adult medical examination with abnormal findings   [facility] [place], Coralie Keens, NP   9 months ago Callus of foot   [place]  [facility] Devonne Doughty, DO   1 year ago Essential hypertension   [facility] [place], Coralie Keens, NP   1 year ago Essential hypertension   [place], FNP   2 years ago Blood glucose elevated   [place], FNP       Future Appointments             In 3 months Baity, Coralie Keens, NP [place] BP in normal range    BP Readings from Last 1 Encounters:  07/25/21 122/86

## 2022-05-03 ENCOUNTER — Encounter: Payer: Self-pay | Admitting: Internal Medicine

## 2022-05-03 ENCOUNTER — Ambulatory Visit (INDEPENDENT_AMBULATORY_CARE_PROVIDER_SITE_OTHER): Payer: 59 | Admitting: Internal Medicine

## 2022-05-03 VITALS — BP 124/76 | HR 68 | Temp 97.3°F | Wt 176.0 lb

## 2022-05-03 DIAGNOSIS — R350 Frequency of micturition: Secondary | ICD-10-CM

## 2022-05-03 DIAGNOSIS — E7800 Pure hypercholesterolemia, unspecified: Secondary | ICD-10-CM

## 2022-05-03 DIAGNOSIS — R7303 Prediabetes: Secondary | ICD-10-CM | POA: Diagnosis not present

## 2022-05-03 DIAGNOSIS — Z23 Encounter for immunization: Secondary | ICD-10-CM

## 2022-05-03 DIAGNOSIS — Z6826 Body mass index (BMI) 26.0-26.9, adult: Secondary | ICD-10-CM | POA: Diagnosis not present

## 2022-05-03 DIAGNOSIS — E663 Overweight: Secondary | ICD-10-CM

## 2022-05-03 DIAGNOSIS — M85852 Other specified disorders of bone density and structure, left thigh: Secondary | ICD-10-CM

## 2022-05-03 DIAGNOSIS — E785 Hyperlipidemia, unspecified: Secondary | ICD-10-CM | POA: Diagnosis not present

## 2022-05-03 DIAGNOSIS — I10 Essential (primary) hypertension: Secondary | ICD-10-CM

## 2022-05-03 MED ORDER — HYDROCHLOROTHIAZIDE 50 MG PO TABS: 50.0000 mg | ORAL_TABLET | Freq: Every day | ORAL | 1 refills | Status: DC

## 2022-05-03 MED ORDER — ATORVASTATIN CALCIUM 10 MG PO TABS: 10.0000 mg | ORAL_TABLET | Freq: Every day | ORAL | 1 refills | Status: DC

## 2022-05-03 MED ORDER — AMLODIPINE BESYLATE 10 MG PO TABS: 10.0000 mg | ORAL_TABLET | Freq: Every day | ORAL | 1 refills | Status: DC

## 2022-05-03 NOTE — Patient Instructions (Signed)

## 2022-05-03 NOTE — Assessment & Plan Note (Signed)
C-Met and lipid profile today Encourage her to consume a low-fat diet Continue atorvastatin, refilled today

## 2022-05-03 NOTE — Assessment & Plan Note (Signed)
A1c today Encourage low-carb diet and exercise for weight loss 

## 2022-05-03 NOTE — Assessment & Plan Note (Signed)
Encourage daily weightbearing exercise Continue calcium and vitamin D 

## 2022-05-03 NOTE — Assessment & Plan Note (Signed)
Encourage diet and exercise for weight loss 

## 2022-05-03 NOTE — Assessment & Plan Note (Signed)
Controlled on amlodipine and HCTZ, refilled today Reinforced DASH diet and exercise for weight loss C-Met today

## 2022-05-03 NOTE — Progress Notes (Signed)
Subjective:    Patient ID: Linda Guzman, female    DOB: 1956/04/02, 66 y.o.   MRN: [id number]  HPI  Patient presents to clinic today for follow-up of chronic conditions.  HTN: Her BP today is 124/76.  She is taking Amlodipine and HCTZ as prescribed.  ECG from 07/2018 reviewed.  HLD: Her last LDL was 146, triglycerides 88, 07/2021.  She is Atorvastatin as prescribed.  She tries to consume low-fat diet.  Prediabetes: Her last A1c was 6.4%, 07/2021.  She is not taking any oral diabetic medication at this time.  She does not check her sugars.  Osteopenia: Bone density from 08/2018 reviewed.  She is taking Calcium and Vitamin D OTC.  She gets weightbearing exercise daily.  Review of Systems     Past Medical History:  Diagnosis Date   Hypertension     Current Outpatient Medications  Medication Sig Dispense Refill   amLODipine (NORVASC) 10 MG tablet Take 1 tablet (10 mg total) by mouth daily. Please schedule an office before anymore refills. 30 tablet 0   atorvastatin (LIPITOR) 10 MG tablet Take 1 tablet (10 mg total) by mouth daily. 90 tablet 3   cholecalciferol (VITAMIN D) 1000 units tablet Take 1,000 Units by mouth daily.     doxycycline (VIBRA-TABS) 100 MG tablet For Malaria. Start 1-2 days before trip, take 1 daily, continue 4 weeks after travel. Take w full glass water stay up 30 min after taking 45 tablet 1   hydrochlorothiazide (HYDRODIURIL) 50 MG tablet Take 1 tablet (50 mg total) by mouth daily. Please schedule an office visit before any more refills 30 tablet 0   Magnesium Oxide 250 MG TABS Take 250 mg by mouth daily.      No current facility-administered medications for this visit.    Allergies  Allergen Reactions   Chloroquine Itching   Propofol     Patient had prolonged fatigue, weakness several weeks after propofol use for colonoscopy 2020. - Patient is not truly allergic, but this med should be used with caution in future    Family History  Problem Relation Age  of Onset   Hypertension Mother    Stroke Sister    Breast cancer Neg Hx     Social History   Socioeconomic History   Marital status: Married    Spouse name: Not on file   Number of children: Not on file   Years of education: Not on file   Highest education level: Not on file  Occupational History   Not on file  Tobacco Use   Smoking status: Never   Smokeless tobacco: Never  Vaping Use   Vaping Use: Never used  Substance and Sexual Activity   Alcohol use: No   Drug use: No   Sexual activity: Yes    Birth control/protection: Post-menopausal  Other Topics Concern   Not on file  Social History Narrative   Not on file   Social Determinants of Health   Financial Resource Strain: Not on file  Food Insecurity: Not on file  Transportation Needs: Not on file  Physical Activity: Not on file  Stress: Not on file  Social Connections: Not on file  Intimate Partner Violence: Not on file     Constitutional: Denies fever, malaise, fatigue, headache or abrupt weight changes.  HEENT: Denies eye pain, eye redness, ear pain, ringing in the ears, wax buildup, runny nose, nasal congestion, bloody nose, or sore throat. Respiratory: Denies difficulty breathing, shortness of breath, cough or sputum  production.   Cardiovascular: Denies chest pain, chest tightness, palpitations or swelling in the hands or feet.  Gastrointestinal: Denies abdominal pain, bloating, constipation, diarrhea or blood in the stool.  GU: Pt reports urinary frequency. Denies urgency, pain with urination, burning sensation, blood in urine, odor or discharge. Musculoskeletal: Denies decrease in range of motion, difficulty with gait, muscle pain or joint pain and swelling.  Skin: Denies redness, rashes, lesions or ulcercations.  Neurological: Denies dizziness, difficulty with memory, difficulty with speech or problems with balance and coordination.  Psych: Denies anxiety, depression, SI/HI.  No other specific complaints  in a complete review of systems (except as listed in HPI above).  Objective:   Physical Exam  BP 124/76 (BP Location: Left Arm, Patient Position: Sitting, Cuff Size: Large)   Pulse 68   Temp (!) 97.3 F (36.3 C) (Temporal)   Wt 176 lb (79.8 kg)   SpO2 100%   BMI 26.76 kg/m   Wt Readings from Last 3 Encounters:  07/25/21 183 lb (83 kg)  07/10/21 180 lb 6.4 oz (81.8 kg)  01/01/21 186 lb (84.4 kg)    General: Appears her stated age, overweight,  in NAD. Skin: Warm, dry and intact.  HEENT: Head: normal shape and size; Eyes: sclera white, no icterus, conjunctiva pink, PERRLA and EOMs intact;  Cardiovascular: Normal rate and rhythm. S1,S2 noted.  No murmur, rubs or gallops noted. No JVD or BLE edema. No carotid bruits noted. Pulmonary/Chest: Normal effort and positive vesicular breath sounds. No respiratory distress. No wheezes, rales or ronchi noted.  Musculoskeletal:  No difficulty with gait.  Neurological: Alert and oriented. Cranial nerves II-XII grossly intact. Coordination normal.    BMET    Component Value Date/Time   NA 140 01/01/2021 0822   K 3.5 01/01/2021 0822   CL 100 01/01/2021 0822   CO2 28 01/01/2021 0822   GLUCOSE 121 (H) 01/01/2021 0822   BUN 11 01/01/2021 0822   CREATININE 0.89 01/01/2021 0822   CALCIUM 9.9 01/01/2021 0822   GFRNONAA 60 07/04/2020 0837   GFRAA 70 07/04/2020 0837    Lipid Panel     Component Value Date/Time   CHOL 224 (H) 01/01/2021 0822   TRIG 88 01/01/2021 0822   HDL 59 01/01/2021 0822   CHOLHDL 3.8 01/01/2021 0822   VLDL 20 01/08/2017 0904   LDLCALC 146 (H) 01/01/2021 0822    CBC    Component Value Date/Time   WBC 6.1 07/04/2020 0837   RBC 5.07 07/04/2020 0837   HGB 14.1 07/04/2020 0837   HCT 42.9 07/04/2020 0837   PLT 293 07/04/2020 0837   MCV 84.6 07/04/2020 0837   MCH 27.8 07/04/2020 0837   MCHC 32.9 07/04/2020 0837   RDW 13.2 07/04/2020 0837   LYMPHSABS 2,227 07/04/2020 0837   MONOABS 0.4 07/21/2018 1157   EOSABS  128 07/04/2020 0837   BASOSABS 43 07/04/2020 0837    Hgb A1C Lab Results  Component Value Date   HGBA1C 6.4 (H) 01/01/2021            Assessment & Plan:   Urinary Frequency:  No other signs and symptoms of infection, will hold off on urinalysis at this time We will check a C  RTC in 6 months for annual exam Webb Silversmith, NP

## 2022-05-04 LAB — CBC
HCT: 44.3 % (ref 35.0–45.0)
Hemoglobin: 14.5 g/dL (ref 11.7–15.5)
MCH: 28.2 pg (ref 27.0–33.0)
MCHC: 32.7 g/dL (ref 32.0–36.0)
MCV: 86.2 fL (ref 80.0–100.0)
MPV: 11.8 fL (ref 7.5–12.5)
Platelets: 235 10*3/uL (ref 140–400)
RBC: 5.14 10*6/uL — ABNORMAL HIGH (ref 3.80–5.10)
RDW: 13.5 % (ref 11.0–15.0)
WBC: 5.6 10*3/uL (ref 3.8–10.8)

## 2022-05-04 LAB — COMPLETE METABOLIC PANEL WITH GFR
AG Ratio: 1.3 (calc) (ref 1.0–2.5)
ALT: 21 U/L (ref 6–29)
AST: 23 U/L (ref 10–35)
Albumin: 4.4 g/dL (ref 3.6–5.1)
Alkaline phosphatase (APISO): 66 U/L (ref 37–153)
BUN: 8 mg/dL (ref 7–25)
CO2: 28 mmol/L (ref 20–32)
Calcium: 9.9 mg/dL (ref 8.6–10.4)
Chloride: 100 mmol/L (ref 98–110)
Creat: 0.82 mg/dL (ref 0.50–1.05)
Globulin: 3.4 g/dL (calc) (ref 1.9–3.7)
Glucose, Bld: 99 mg/dL (ref 65–99)
Potassium: 3.1 mmol/L — ABNORMAL LOW (ref 3.5–5.3)
Sodium: 142 mmol/L (ref 135–146)
Total Bilirubin: 0.6 mg/dL (ref 0.2–1.2)
Total Protein: 7.8 g/dL (ref 6.1–8.1)
eGFR: 79 mL/min/{1.73_m2} (ref >=60)

## 2022-05-04 LAB — LIPID PANEL
Cholesterol: 142 mg/dL (ref <200)
HDL: 59 mg/dL (ref >=50)
LDL Cholesterol (Calc): 67 mg/dL (calc)
Non-HDL Cholesterol (Calc): 83 mg/dL (calc) (ref <130)
Total CHOL/HDL Ratio: 2.4 (calc) (ref <5.0)
Triglycerides: 81 mg/dL (ref <150)

## 2022-05-04 LAB — HEMOGLOBIN A1C
Hgb A1c MFr Bld: 6.2 %{Hb} — ABNORMAL HIGH
Mean Plasma Glucose: 131 mg/dL
eAG (mmol/L): 7.3 mmol/L

## 2022-07-30 ENCOUNTER — Ambulatory Visit (INDEPENDENT_AMBULATORY_CARE_PROVIDER_SITE_OTHER): Payer: 59 | Admitting: Internal Medicine

## 2022-07-30 ENCOUNTER — Encounter: Payer: Self-pay | Admitting: Internal Medicine

## 2022-07-30 VITALS — BP 134/86 | HR 75 | Temp 96.9°F | Ht 68.0 in | Wt 182.0 lb

## 2022-07-30 DIAGNOSIS — Z0001 Encounter for general adult medical examination with abnormal findings: Secondary | ICD-10-CM | POA: Diagnosis not present

## 2022-07-30 DIAGNOSIS — Z1211 Encounter for screening for malignant neoplasm of colon: Secondary | ICD-10-CM

## 2022-07-30 DIAGNOSIS — R7303 Prediabetes: Secondary | ICD-10-CM

## 2022-07-30 DIAGNOSIS — Z1231 Encounter for screening mammogram for malignant neoplasm of breast: Secondary | ICD-10-CM

## 2022-07-30 DIAGNOSIS — Z780 Asymptomatic menopausal state: Secondary | ICD-10-CM

## 2022-07-30 DIAGNOSIS — E663 Overweight: Secondary | ICD-10-CM | POA: Diagnosis not present

## 2022-07-30 DIAGNOSIS — E7800 Pure hypercholesterolemia, unspecified: Secondary | ICD-10-CM

## 2022-07-30 DIAGNOSIS — Z6827 Body mass index (BMI) 27.0-27.9, adult: Secondary | ICD-10-CM

## 2022-07-30 NOTE — Assessment & Plan Note (Signed)
Encourage diet and exercise for weight loss 

## 2022-07-30 NOTE — Progress Notes (Signed)
Subjective:    Patient ID: Linda Guzman, female    DOB: 11/28/1955, 66 y.o.   MRN: [id number]  HPI  Patient presents to clinic today for her annual exam.  Flu: 04/2022 Tetanus: 07/2021 COVID: x 3 Pneumovax: Never Prevnar 20: 07/2021 Shingrix: Never Pap smear: 03/2018 Mammogram: 08/2018 Bone density: 08/2018 Colon screening: 10/2018 Vision screening: annually Dentist: as needed  Diet: She does eat meat. She consumes fruits and veggies. She does eat fried foods. She drinks mostly water. Exercise: Walking  Review of Systems     Past Medical History:  Diagnosis Date   Hypertension     Current Outpatient Medications  Medication Sig Dispense Refill   amLODipine (NORVASC) 10 MG tablet Take 1 tablet (10 mg total) by mouth daily. 90 tablet 1   atorvastatin (LIPITOR) 10 MG tablet Take 1 tablet (10 mg total) by mouth daily. 90 tablet 1   cholecalciferol (VITAMIN D) 1000 units tablet Take 1,000 Units by mouth daily.     hydrochlorothiazide (HYDRODIURIL) 50 MG tablet Take 1 tablet (50 mg total) by mouth daily. 90 tablet 1   Magnesium Oxide 250 MG TABS Take 250 mg by mouth daily.      No current facility-administered medications for this visit.    Allergies  Allergen Reactions   Chloroquine Itching   Propofol     Patient had prolonged fatigue, weakness several weeks after propofol use for colonoscopy 2020. - Patient is not truly allergic, but this med should be used with caution in future    Family History  Problem Relation Age of Onset   Hypertension Mother    Stroke Sister    Breast cancer Neg Hx     Social History   Socioeconomic History   Marital status: Married    Spouse name: Not on file   Number of children: Not on file   Years of education: Not on file   Highest education level: Not on file  Occupational History   Not on file  Tobacco Use   Smoking status: Never   Smokeless tobacco: Never  Vaping Use   Vaping Use: Never used  Substance and Sexual  Activity   Alcohol use: No   Drug use: No   Sexual activity: Yes    Birth control/protection: Post-menopausal  Other Topics Concern   Not on file  Social History Narrative   Not on file   Social Determinants of Health   Financial Resource Strain: Not on file  Food Insecurity: Not on file  Transportation Needs: Not on file  Physical Activity: Not on file  Stress: Not on file  Social Connections: Not on file  Intimate Partner Violence: Not on file     Constitutional: Denies fever, malaise, fatigue, headache or abrupt weight changes.  HEENT: Denies eye pain, eye redness, ear pain, ringing in the ears, wax buildup, runny nose, nasal congestion, bloody nose, or sore throat. Respiratory: Denies difficulty breathing, shortness of breath, cough or sputum production.   Cardiovascular: Denies chest pain, chest tightness, palpitations or swelling in the hands or feet.  Gastrointestinal: Denies abdominal pain, bloating, constipation, diarrhea or blood in the stool.  GU: Denies urgency, frequency, pain with urination, burning sensation, blood in urine, odor or discharge. Musculoskeletal: Denies decrease in range of motion, difficulty with gait, muscle pain or joint pain and swelling.  Skin: Denies redness, rashes, lesions or ulcercations.  Neurological: Patient reports numbness and tingling from her elbows down to her hands.  Denies dizziness, difficulty with memory, difficulty  with speech or problems with balance and coordination.  Psych: Denies anxiety, depression, SI/HI.  No other specific complaints in a complete review of systems (except as listed in HPI above).  Objective:   Physical Exam  BP 134/86 (BP Location: Right Arm, Patient Position: Sitting, Cuff Size: Normal)   Pulse 75   Temp (!) 96.9 F (36.1 C) (Temporal)   Ht _0  (1.727 m)   Wt 182 lb (82.6 kg)   SpO2 99%   BMI 27.67 kg/m   Wt Readings from Last 3 Encounters:  05/03/22 176 lb (79.8 kg)  07/25/21 183 lb (83  kg)  07/10/21 180 lb 6.4 oz (81.8 kg)    General: Appears her stated age, overweight, in NAD. Skin: Warm, dry and intact.  HEENT: Head: normal shape and size; Eyes: sclera white, no icterus, conjunctiva pink, PERRLA and EOMs intact;  Neck:  Neck supple, trachea midline. No masses, lumps or thyromegaly present.  Cardiovascular: Normal rate and rhythm. S1,S2 noted.  No murmur, rubs or gallops noted. No JVD or BLE edema. No carotid bruits noted. Pulmonary/Chest: Normal effort and positive vesicular breath sounds. No respiratory distress. No wheezes, rales or ronchi noted.  Abdomen: Soft and nontender. Normal bowel sounds.  Musculoskeletal: Strength 5/5 BUE/BLE.  No difficulty with gait.  Neurological: Alert and oriented. Cranial nerves II-XII grossly intact. Coordination normal.  Psychiatric: Mood and affect normal. Behavior is normal. Judgment and thought content normal.     BMET    Component Value Date/Time   NA 142 05/03/2022 1000   K 3.1 (L) 05/03/2022 1000   CL 100 05/03/2022 1000   CO2 28 05/03/2022 1000   GLUCOSE 99 05/03/2022 1000   BUN 8 05/03/2022 1000   CREATININE 0.82 05/03/2022 1000   CALCIUM 9.9 05/03/2022 1000   GFRNONAA 60 07/04/2020 0837   GFRAA 70 07/04/2020 0837    Lipid Panel     Component Value Date/Time   CHOL 142 05/03/2022 1000   TRIG 81 05/03/2022 1000   HDL 59 05/03/2022 1000   CHOLHDL 2.4 05/03/2022 1000   VLDL 20 01/08/2017 0904   LDLCALC 67 05/03/2022 1000    CBC    Component Value Date/Time   WBC 5.6 05/03/2022 1000   RBC 5.14 (H) 05/03/2022 1000   HGB 14.5 05/03/2022 1000   HCT 44.3 05/03/2022 1000   PLT 235 05/03/2022 1000   MCV 86.2 05/03/2022 1000   MCH 28.2 05/03/2022 1000   MCHC 32.7 05/03/2022 1000   RDW 13.5 05/03/2022 1000   LYMPHSABS 2,227 07/04/2020 0837   MONOABS 0.4 07/21/2018 1157   EOSABS 128 07/04/2020 0837   BASOSABS 43 07/04/2020 0837    Hgb A1C Lab Results  Component Value Date   HGBA1C 6.2 (H) 05/03/2022           Assessment & Plan:   Preventative Health Maintenance:  Flu shot UTD Tetanus UTD Encouraged her to get her COVID-vaccine Prevnar UTD, will not need Pneumovax Discussed Shingrix vaccine, she will check coverage with her insurance company and schedule a nurse visit if she would like to have this done Pap smear UTD Mammogram and bone density ordered-she will call to schedule Referral to GI for screening colonoscopy Encouraged her to consume a balanced diet and exercise regimen Advised her to see an eye doctor and dentist annually We will check CBC, c-Met, lipid, A1c today  RTC in 6 months, follow-up chronic conditions Webb Silversmith, NP

## 2022-07-30 NOTE — Patient Instructions (Signed)
Health Maintenance for Postmenopausal Women Menopause is a normal process in which your ability to get pregnant comes to an end. This process happens slowly over many months or years, usually between the ages of 48 and 55. Menopause is complete when you have missed your menstrual period for 12 months. It is important to talk with your health care provider about some of the most common conditions that affect women after menopause (postmenopausal women). These include heart disease, cancer, and bone loss (osteoporosis). Adopting a healthy lifestyle and getting preventive care can help to promote your health and wellness. The actions you take can also lower your chances of developing some of these common conditions. What are the signs and symptoms of menopause? During menopause, you may have the following symptoms: Hot flashes. These can be moderate or severe. Night sweats. Decrease in sex drive. Mood swings. Headaches. Tiredness (fatigue). Irritability. Memory problems. Problems falling asleep or staying asleep. Talk with your health care provider about treatment options for your symptoms. Do I need hormone replacement therapy? Hormone replacement therapy is effective in treating symptoms that are caused by menopause, such as hot flashes and night sweats. Hormone replacement carries certain risks, especially as you become older. If you are thinking about using estrogen or estrogen with progestin, discuss the benefits and risks with your health care provider. How can I reduce my risk for heart disease and stroke? The risk of heart disease, heart attack, and stroke increases as you age. One of the causes may be a change in the body's hormones during menopause. This can affect how your body uses dietary fats, triglycerides, and cholesterol. Heart attack and stroke are medical emergencies. There are many things that you can do to help prevent heart disease and stroke. Watch your blood pressure High  blood pressure causes heart disease and increases the risk of stroke. This is more likely to develop in people who have high blood pressure readings or are overweight. Have your blood pressure checked: Every 3-5 years if you are 18-39 years of age. Every year if you are 40 years old or older. Eat a healthy diet  Eat a diet that includes plenty of vegetables, fruits, low-fat dairy products, and lean protein. Do not eat a lot of foods that are high in solid fats, added sugars, or sodium. Get regular exercise Get regular exercise. This is one of the most important things you can do for your health. Most adults should: Try to exercise for at least 150 minutes each week. The exercise should increase your heart rate and make you sweat (moderate-intensity exercise). Try to do strengthening exercises at least twice each week. Do these in addition to the moderate-intensity exercise. Spend less time sitting. Even light physical activity can be beneficial. Other tips Work with your health care provider to achieve or maintain a healthy weight. Do not use any products that contain nicotine or tobacco. These products include cigarettes, chewing tobacco, and vaping devices, such as e-cigarettes. If you need help quitting, ask your health care provider. Know your numbers. Ask your health care provider to check your cholesterol and your blood sugar (glucose). Continue to have your blood tested as directed by your health care provider. Do I need screening for cancer? Depending on your health history and family history, you may need to have cancer screenings at different stages of your life. This may include screening for: Breast cancer. Cervical cancer. Lung cancer. Colorectal cancer. What is my risk for osteoporosis? After menopause, you may be   at increased risk for osteoporosis. Osteoporosis is a condition in which bone destruction happens more quickly than new bone creation. To help prevent osteoporosis or  the bone fractures that can happen because of osteoporosis, you may take the following actions: If you are 19-50 years old, get at least 1,000 mg of calcium and at least 600 international units (IU) of vitamin D per day. If you are older than age 50 but younger than age 70, get at least 1,200 mg of calcium and at least 600 international units (IU) of vitamin D per day. If you are older than age 70, get at least 1,200 mg of calcium and at least 800 international units (IU) of vitamin D per day. Smoking and drinking excessive alcohol increase the risk of osteoporosis. Eat foods that are rich in calcium and vitamin D, and do weight-bearing exercises several times each week as directed by your health care provider. How does menopause affect my mental health? Depression may occur at any age, but it is more common as you become older. Common symptoms of depression include: Feeling depressed. Changes in sleep patterns. Changes in appetite or eating patterns. Feeling an overall lack of motivation or enjoyment of activities that you previously enjoyed. Frequent crying spells. Talk with your health care provider if you think that you are experiencing any of these symptoms. General instructions See your health care provider for regular wellness exams and vaccines. This may include: Scheduling regular health, dental, and eye exams. Getting and maintaining your vaccines. These include: Influenza vaccine. Get this vaccine each year before the flu season begins. Pneumonia vaccine. Shingles vaccine. Tetanus, diphtheria, and pertussis (Tdap) booster vaccine. Your health care provider may also recommend other immunizations. Tell your health care provider if you have ever been abused or do not feel safe at home. Summary Menopause is a normal process in which your ability to get pregnant comes to an end. This condition causes hot flashes, night sweats, decreased interest in sex, mood swings, headaches, or lack  of sleep. Treatment for this condition may include hormone replacement therapy. Take actions to keep yourself healthy, including exercising regularly, eating a healthy diet, watching your weight, and checking your blood pressure and blood sugar levels. Get screened for cancer and depression. Make sure that you are up to date with all your vaccines. This information is not intended to replace advice given to you by your health care provider. Make sure you discuss any questions you have with your health care provider. Document Revised: 12/25/2020 Document Reviewed: 12/25/2020 Elsevier Patient Education  2023 [employer]  

## 2022-07-31 LAB — COMPLETE METABOLIC PANEL WITH GFR
AG Ratio: 1.2 (calc) (ref 1.0–2.5)
ALT: 24 U/L (ref 6–29)
AST: 22 U/L (ref 10–35)
Albumin: 4.4 g/dL (ref 3.6–5.1)
Alkaline phosphatase (APISO): 70 U/L (ref 37–153)
BUN: 8 mg/dL (ref 7–25)
CO2: 29 mmol/L (ref 20–32)
Calcium: 9.9 mg/dL (ref 8.6–10.4)
Chloride: 101 mmol/L (ref 98–110)
Creat: 0.75 mg/dL (ref 0.50–1.05)
Globulin: 3.7 g/dL (calc) (ref 1.9–3.7)
Glucose, Bld: 113 mg/dL — ABNORMAL HIGH (ref 65–99)
Potassium: 3.4 mmol/L — ABNORMAL LOW (ref 3.5–5.3)
Sodium: 142 mmol/L (ref 135–146)
Total Bilirubin: 0.6 mg/dL (ref 0.2–1.2)
Total Protein: 8.1 g/dL (ref 6.1–8.1)
eGFR: 88 mL/min/{1.73_m2} (ref >=60)

## 2022-07-31 LAB — HEMOGLOBIN A1C
Hgb A1c MFr Bld: 6.7 % of total Hgb — ABNORMAL HIGH (ref <5.7)
Mean Plasma Glucose: 146 mg/dL
eAG (mmol/L): 8.1 mmol/L

## 2022-07-31 LAB — CBC
HCT: 43.3 % (ref 35.0–45.0)
Hemoglobin: 14 g/dL (ref 11.7–15.5)
MCH: 27.6 pg (ref 27.0–33.0)
MCHC: 32.3 g/dL (ref 32.0–36.0)
MCV: 85.4 fL (ref 80.0–100.0)
MPV: 11.2 fL (ref 7.5–12.5)
Platelets: 300 10*3/uL (ref 140–400)
RBC: 5.07 10*6/uL (ref 3.80–5.10)
RDW: 13.7 % (ref 11.0–15.0)
WBC: 5.7 10*3/uL (ref 3.8–10.8)

## 2022-07-31 LAB — LIPID PANEL
Cholesterol: 150 mg/dL (ref <200)
HDL: 65 mg/dL (ref >=50)
LDL Cholesterol (Calc): 69 mg/dL (calc)
Non-HDL Cholesterol (Calc): 85 mg/dL (calc) (ref <130)
Total CHOL/HDL Ratio: 2.3 (calc) (ref <5.0)
Triglycerides: 80 mg/dL (ref <150)

## 2022-08-02 ENCOUNTER — Encounter: Payer: Self-pay | Admitting: Internal Medicine

## 2022-08-02 ENCOUNTER — Ambulatory Visit (INDEPENDENT_AMBULATORY_CARE_PROVIDER_SITE_OTHER): Payer: 59 | Admitting: Internal Medicine

## 2022-08-02 VITALS — BP 120/78 | HR 78 | Ht 68.0 in | Wt 180.2 lb

## 2022-08-02 DIAGNOSIS — E663 Overweight: Secondary | ICD-10-CM

## 2022-08-02 DIAGNOSIS — E1165 Type 2 diabetes mellitus with hyperglycemia: Secondary | ICD-10-CM | POA: Diagnosis not present

## 2022-08-02 DIAGNOSIS — Z6827 Body mass index (BMI) 27.0-27.9, adult: Secondary | ICD-10-CM | POA: Diagnosis not present

## 2022-08-02 NOTE — Assessment & Plan Note (Signed)
Encourage diet and exercise for weight loss 

## 2022-08-02 NOTE — Patient Instructions (Signed)

## 2022-08-02 NOTE — Assessment & Plan Note (Signed)
Discussed diabetes and standards of medical care She would like to try 3 months of lifestyle changes Encourage low-carb diet and exercise for weight loss Encourage routine eye exam Encouraged routine foot exam Flu and pneumonia vaccine UTD Encouraged her to get her COVID booster

## 2022-08-02 NOTE — Progress Notes (Signed)
Subjective:    Patient ID: Linda Guzman, female    DOB: [birth year], 65 y.o.   MRN: [id number]  HPI  Patient presents to clinic today for follow-up of labs.  She had a recent A1c of 6.7% indicating new onset diabetes.  She does report increased thirst and urinary frequency.  She does admit that she consumes a lot of carbs.  She does not think diabetes runs in her family.  Review of Systems     Past Medical History:  Diagnosis Date   Hypertension     Current Outpatient Medications  Medication Sig Dispense Refill   amLODipine (NORVASC) 10 MG tablet Take 1 tablet (10 mg total) by mouth daily. 90 tablet 1   atorvastatin (LIPITOR) 10 MG tablet Take 1 tablet (10 mg total) by mouth daily. 90 tablet 1   cholecalciferol (VITAMIN D) 1000 units tablet Take 1,000 Units by mouth daily.     hydrochlorothiazide (HYDRODIURIL) 50 MG tablet Take 1 tablet (50 mg total) by mouth daily. 90 tablet 1   Magnesium Oxide 250 MG TABS Take 250 mg by mouth daily.      No current facility-administered medications for this visit.    Allergies  Allergen Reactions   Chloroquine Itching   Propofol     Patient had prolonged fatigue, weakness several weeks after propofol use for colonoscopy 2020. - Patient is not truly allergic, but this med should be used with caution in future    Family History  Problem Relation Age of Onset   Hypertension Mother    Stroke Sister    Breast cancer Neg Hx     Social History   Socioeconomic History   Marital status: Married    Spouse name: Not on file   Number of children: Not on file   Years of education: Not on file   Highest education level: Not on file  Occupational History   Not on file  Tobacco Use   Smoking status: Never   Smokeless tobacco: Never  Vaping Use   Vaping Use: Never used  Substance and Sexual Activity   Alcohol use: No   Drug use: No   Sexual activity: Yes    Birth control/protection: Post-menopausal  Other Topics Concern   Not on file   Social History Narrative   Not on file   Social Determinants of Health   Financial Resource Strain: Not on file  Food Insecurity: Not on file  Transportation Needs: Not on file  Physical Activity: Not on file  Stress: Not on file  Social Connections: Not on file  Intimate Partner Violence: Not on file     Constitutional: Denies fever, malaise, fatigue, headache or abrupt weight changes.  HEENT: Denies eye pain, eye redness, ear pain, ringing in the ears, wax buildup, runny nose, nasal congestion, bloody nose, or sore throat. Respiratory: Denies difficulty breathing, shortness of breath, cough or sputum production.   Cardiovascular: Denies chest pain, chest tightness, palpitations or swelling in the hands or feet.  Gastrointestinal: Patient reports increased thirst.  Denies abdominal pain, bloating, constipation, diarrhea or blood in the stool.  GU: Patient reports urinary frequency.  Denies urgency, frequency, pain with urination, burning sensation, blood in urine, odor or discharge. Musculoskeletal: Denies decrease in range of motion, difficulty with gait, muscle pain or joint pain and swelling.  Skin: Denies redness, rashes, lesions or ulcercations.  Neurological: Denies dizziness, difficulty with memory, difficulty with speech or problems with balance and coordination.  Psych: Denies anxiety, depression, SI/HI.  No other specific complaints in a complete review of systems (except as listed in HPI above).  Objective:   Physical Exam  BP 120/78   Pulse 78   Ht '5\' 8"'$  (1.727 m)   Wt 180 lb 3.2 oz (81.7 kg)   SpO2 99%   BMI 27.40 kg/m   Wt Readings from Last 3 Encounters:  07/30/22 182 lb (82.6 kg)  05/03/22 176 lb (79.8 kg)  07/25/21 183 lb (83 kg)    General: Appears her stated age, obese in NAD. Skin: Warm, dry and intact. No ulcerations noted. HEENT: Head: normal shape and size; Eyes: sclera white, no icterus, conjunctiva pink, PERRLA and EOMs intact;   Cardiovascular: Normal rate and rhythm. S1,S2 noted.  No murmur, rubs or gallops noted. No JVD or BLE edema. No carotid bruits noted. Pulmonary/Chest: Normal effort and positive vesicular breath sounds. No respiratory distress. No wheezes, rales or ronchi noted.  Neurological: Alert and oriented.    BMET    Component Value Date/Time   NA 142 07/30/2022 0851   K 3.4 (L) 07/30/2022 0851   CL 101 07/30/2022 0851   CO2 29 07/30/2022 0851   GLUCOSE 113 (H) 07/30/2022 0851   BUN 8 07/30/2022 0851   CREATININE 0.75 07/30/2022 0851   CALCIUM 9.9 07/30/2022 0851   GFRNONAA 60 07/04/2020 0837   GFRAA 70 07/04/2020 0837    Lipid Panel     Component Value Date/Time   CHOL 150 07/30/2022 0851   TRIG 80 07/30/2022 0851   HDL 65 07/30/2022 0851   CHOLHDL 2.3 07/30/2022 0851   VLDL 20 01/08/2017 0904   LDLCALC 69 07/30/2022 0851    CBC    Component Value Date/Time   WBC 5.7 07/30/2022 0851   RBC 5.07 07/30/2022 0851   HGB 14.0 07/30/2022 0851   HCT 43.3 07/30/2022 0851   PLT 300 07/30/2022 0851   MCV 85.4 07/30/2022 0851   MCH 27.6 07/30/2022 0851   MCHC 32.3 07/30/2022 0851   RDW 13.7 07/30/2022 0851   LYMPHSABS 2,227 07/04/2020 0837   MONOABS 0.4 07/21/2018 1157   EOSABS 128 07/04/2020 0837   BASOSABS 43 07/04/2020 0837    Hgb A1C Lab Results  Component Value Date   HGBA1C 6.7 (H) 07/30/2022           Assessment & Plan:     RTC in 3 months, follow-up chronic conditions Webb Silversmith, NP

## 2022-08-05 ENCOUNTER — Telehealth: Payer: Self-pay | Admitting: Gastroenterology

## 2022-08-05 NOTE — Telephone Encounter (Signed)
Patient needs to be scheduled for a repeat colonoscopy. Please call to schedule.

## 2022-08-06 ENCOUNTER — Other Ambulatory Visit: Payer: Self-pay | Admitting: *Deleted

## 2022-08-06 ENCOUNTER — Telehealth: Payer: Self-pay | Admitting: *Deleted

## 2022-08-06 DIAGNOSIS — Z86010 Personal history of colonic polyps: Secondary | ICD-10-CM

## 2022-08-06 MED ORDER — NA SULFATE-K SULFATE-MG SULF 17.5-3.13-1.6 GM/177ML PO SOLN: 1.0000 | Freq: Once | ORAL | 0 refills | Status: AC

## 2022-08-06 NOTE — Telephone Encounter (Signed)
Gastroenterology Pre-Procedure Review  Request Date: 09/10/2022 Requesting Physician: Dr. Allen Norris  PATIENT REVIEW QUESTIONS: The patient responded to the following health history questions as indicated:    1. Are you having any GI issues? no 2. Do you have a personal history of Polyps? yes (10/27/2018) 3. Do you have a family history of Colon Cancer or Polyps? no 4. Diabetes Mellitus? no 5. Joint replacements in the past 12 months?no 6. Major health problems in the past 3 months?no 7. Any artificial heart valves, MVP, or defibrillator?no    MEDICATIONS & ALLERGIES:    Patient reports the following regarding taking any anticoagulation/antiplatelet therapy:   Plavix, Coumadin, Eliquis, Xarelto, Lovenox, Pradaxa, Brilinta, or Effient? no Aspirin? no  Patient confirms/reports the following medications:  Current Outpatient Medications  Medication Sig Dispense Refill   amLODipine (NORVASC) 10 MG tablet Take 1 tablet (10 mg total) by mouth daily. 90 tablet 1   atorvastatin (LIPITOR) 10 MG tablet Take 1 tablet (10 mg total) by mouth daily. 90 tablet 1   cholecalciferol (VITAMIN D) 1000 units tablet Take 1,000 Units by mouth daily.     hydrochlorothiazide (HYDRODIURIL) 50 MG tablet Take 1 tablet (50 mg total) by mouth daily. 90 tablet 1   Magnesium Oxide 250 MG TABS Take 250 mg by mouth daily.      No current facility-administered medications for this visit.    Patient confirms/reports the following allergies:  Allergies  Allergen Reactions   Chloroquine Itching   Propofol     Patient had prolonged fatigue, weakness several weeks after propofol use for colonoscopy 2020. - Patient is not truly allergic, but this med should be used with caution in future    No orders of the defined types were placed in this encounter.   AUTHORIZATION INFORMATION Primary Insurance: 1D#: Group #:  Secondary Insurance: 1D#: Group #:  SCHEDULE INFORMATION: Date: 09/10/2022 Time: Location: [facility]

## 2022-08-06 NOTE — Telephone Encounter (Signed)
Called and left a message for call back  

## 2022-09-09 ENCOUNTER — Encounter: Payer: Self-pay | Admitting: Gastroenterology

## 2022-09-10 ENCOUNTER — Ambulatory Visit
Admission: RE | Admit: 2022-09-10 | Discharge: 2022-09-10 | Disposition: A | Discharge status: Home / Self Care | Payer: 59 | Attending: Gastroenterology | Admitting: Gastroenterology

## 2022-09-10 ENCOUNTER — Encounter
Admission: RE | Disposition: A | Discharge status: Home / Self Care | Payer: Self-pay | Source: Home / Self Care | Attending: Gastroenterology

## 2022-09-10 ENCOUNTER — Ambulatory Visit: Payer: 59 | Admitting: Certified Registered"

## 2022-09-10 ENCOUNTER — Encounter: Payer: Self-pay | Admitting: Gastroenterology

## 2022-09-10 DIAGNOSIS — Z09 Encounter for follow-up examination after completed treatment for conditions other than malignant neoplasm: Secondary | ICD-10-CM | POA: Insufficient documentation

## 2022-09-10 DIAGNOSIS — Z79899 Other long term (current) drug therapy: Secondary | ICD-10-CM | POA: Insufficient documentation

## 2022-09-10 DIAGNOSIS — D126 Benign neoplasm of colon, unspecified: Secondary | ICD-10-CM | POA: Diagnosis not present

## 2022-09-10 DIAGNOSIS — K635 Polyp of colon: Secondary | ICD-10-CM

## 2022-09-10 DIAGNOSIS — D120 Benign neoplasm of cecum: Secondary | ICD-10-CM | POA: Diagnosis not present

## 2022-09-10 DIAGNOSIS — D123 Benign neoplasm of transverse colon: Secondary | ICD-10-CM | POA: Insufficient documentation

## 2022-09-10 DIAGNOSIS — E119 Type 2 diabetes mellitus without complications: Secondary | ICD-10-CM | POA: Diagnosis not present

## 2022-09-10 DIAGNOSIS — D125 Benign neoplasm of sigmoid colon: Secondary | ICD-10-CM | POA: Diagnosis not present

## 2022-09-10 DIAGNOSIS — Z860100 Personal history of colon polyps, unspecified: Secondary | ICD-10-CM

## 2022-09-10 DIAGNOSIS — I10 Essential (primary) hypertension: Secondary | ICD-10-CM | POA: Diagnosis not present

## 2022-09-10 DIAGNOSIS — E7800 Pure hypercholesterolemia, unspecified: Secondary | ICD-10-CM | POA: Diagnosis not present

## 2022-09-10 HISTORY — PX: COLONOSCOPY WITH PROPOFOL: SHX5780

## 2022-09-10 SURGERY — COLONOSCOPY WITH PROPOFOL
Anesthesia: General

## 2022-09-10 MED ORDER — PROPOFOL 10 MG/ML IV BOLUS
INTRAVENOUS | Status: DC | PRN
  Administered 2022-09-10: 50 mg via INTRAVENOUS

## 2022-09-10 MED ORDER — PROPOFOL 500 MG/50ML IV EMUL
INTRAVENOUS | Status: DC | PRN
  Administered 2022-09-10: 150 ug/kg/min via INTRAVENOUS

## 2022-09-10 MED ORDER — SODIUM CHLORIDE 0.9 % IV SOLN: INTRAVENOUS | Status: DC

## 2022-09-10 MED ORDER — LIDOCAINE HCL (CARDIAC) PF 100 MG/5ML IV SOSY
PREFILLED_SYRINGE | INTRAVENOUS | Status: DC | PRN
  Administered 2022-09-10: 50 mg via INTRAVENOUS

## 2022-09-10 NOTE — H&P (Signed)
Linda Lame, MD [place] Linda Guzman, Linda Guzman Phone:[phone] Fax : [phone]  Primary Care Physician:  Linda Fenton, NP Primary Gastroenterologist:  Dr. Allen Guzman  Pre-Procedure History & Physical: HPI:  Linda Guzman is a 67 y.o. female is here for an colonoscopy.   Past Medical History:  Diagnosis Date   Hypertension     Past Surgical History:  Procedure Laterality Date   COLONOSCOPY WITH PROPOFOL N/A 05/05/2018   Procedure: COLONOSCOPY WITH PROPOFOL;  Surgeon: Linda Lame, MD;  Location: [facility] ENDOSCOPY;  Service: Endoscopy;  Laterality: N/A;   COLONOSCOPY WITH PROPOFOL N/A 10/27/2018   Procedure: COLONOSCOPY WITH PROPOFOL;  Surgeon: Linda Lame, MD;  Location: [facility] ENDOSCOPY;  Service: Endoscopy;  Laterality: N/A;   FLEXIBLE SIGMOIDOSCOPY N/A 07/27/2018   Procedure: FLEXIBLE SIGMOIDOSCOPY;  Surgeon: Linda Roup, MD;  Location: [facility] ORS;  Service: General;  Laterality: N/A;   NO PAST SURGERIES     TRANSANAL EXCISION OF RECTAL MASS N/A 07/27/2018   Procedure: TRANSANAL EXCISON OF RECTAL POLYP;  Surgeon: Linda Roup, MD;  Location: [facility] ORS;  Service: General;  Laterality: N/A;    Prior to Admission medications   Medication Sig Start Date End Date Taking? Authorizing Provider  amLODipine (NORVASC) 10 MG tablet Take 1 tablet (10 mg total) by mouth daily. 05/03/22   Linda Fenton, NP  atorvastatin (LIPITOR) 10 MG tablet Take 1 tablet (10 mg total) by mouth daily. 05/03/22   Linda Fenton, NP  cholecalciferol (VITAMIN D) 1000 units tablet Take 1,000 Units by mouth daily.    [provider]  hydrochlorothiazide (HYDRODIURIL) 50 MG tablet Take 1 tablet (50 mg total) by mouth daily. 05/03/22   Linda Fenton, NP  Magnesium Oxide 250 MG TABS Take 250 mg by mouth daily.     [provider]    Allergies as of 08/07/2022 - Review Complete 07/30/2022  Allergen Reaction Noted   Chloroquine Itching 12/11/2016   Propofol   11/02/2018    Family History  Problem Relation Age of Onset   Hypertension Mother    Stroke Sister    Breast cancer Neg Hx     Social History   Socioeconomic History   Marital status: Married    Spouse name: Not on file   Number of children: Not on file   Years of education: Not on file   Highest education level: Not on file  Occupational History   Not on file  Tobacco Use   Smoking status: Never   Smokeless tobacco: Never  Vaping Use   Vaping Use: Never used  Substance and Sexual Activity   Alcohol use: No   Drug use: No   Sexual activity: Yes    Birth control/protection: Post-menopausal  Other Topics Concern   Not on file  Social History Narrative   Not on file   Social Determinants of Health   Financial Resource Strain: Not on file  Food Insecurity: Not on file  Transportation Needs: Not on file  Physical Activity: Not on file  Stress: Not on file  Social Connections: Not on file  Intimate Partner Violence: Not on file    Review of Systems: See HPI, otherwise negative ROS  Physical Exam: There were no vitals taken for this visit. General:   Alert,  pleasant and cooperative in NAD Head:  Normocephalic and atraumatic. Neck:  Supple; no masses or thyromegaly. Lungs:  Clear throughout to auscultation.    Heart:  Regular rate and rhythm.  Abdomen:  Soft, nontender and nondistended. Normal bowel sounds, without guarding, and without rebound.   Neurologic:  Alert and  oriented x4;  grossly normal neurologically.  Impression/Plan: Linda Guzman is here for an colonoscopy to be performed for a history of adenomatous polyps on 2020   Risks, benefits, limitations, and alternatives regarding  colonoscopy have been reviewed with the patient.  Questions have been answered.  All parties agreeable.   Linda Lame, MD  09/10/2022, 7:56 AM

## 2022-09-10 NOTE — Op Note (Signed)
[facility] Gastroenterology Patient Name: Linda Guzman Procedure Date: 09/10/2022 8:31 AM MRN: [id number] Account #: [id number] Date of Birth: 08-25-1955 Admit Type: Outpatient Age: 67 Room: [facility] ENDO ROOM 4 Gender: Female Note Status: Finalized Instrument Name: Jasper Riling [id number] Procedure:             Colonoscopy Indications:           High risk colon cancer surveillance: Personal history                         of colonic polyps Providers:             Lucilla Lame MD, MD Referring MD:          Jearld Fenton (Referring MD) Medicines:             Propofol per Anesthesia Complications:         No immediate complications. Procedure:             Pre-Anesthesia Assessment:                        - Prior to the procedure, a History and Physical was                         performed, and patient medications and allergies were                         reviewed. The patient's tolerance of previous                         anesthesia was also reviewed. The risks and benefits                         of the procedure and the sedation options and risks                         were discussed with the patient. All questions were                         answered, and informed consent was obtained. Prior                         Anticoagulants: The patient has taken no anticoagulant                         or antiplatelet agents. ASA Grade Assessment: II - A                         patient with mild systemic disease. After reviewing                         the risks and benefits, the patient was deemed in                         satisfactory condition to undergo the procedure.                        After obtaining informed consent, the colonoscope was  passed under direct vision. Throughout the procedure,                         the patient's blood pressure, pulse, and oxygen                         saturations were monitored continuously. The                          Colonoscope was introduced through the anus and                         advanced to the the cecum, identified by appendiceal                         orifice and ileocecal valve. The colonoscopy was                         performed without difficulty. The patient tolerated                         the procedure well. The quality of the bowel                         preparation was excellent. Findings:      The perianal and digital rectal examinations were normal.      A 2 mm polyp was found in the cecum. The polyp was sessile. The polyp       was removed with a cold snare. Resection and retrieval were complete.      A 4 mm polyp was found in the transverse colon. The polyp was sessile.       The polyp was removed with a cold snare. Resection and retrieval were       complete.      A 4 mm polyp was found in the sigmoid colon. The polyp was sessile. The       polyp was removed with a cold snare. Resection and retrieval were       complete. Impression:            - One 2 mm polyp in the cecum, removed with a cold                         snare. Resected and retrieved.                        - One 4 mm polyp in the transverse colon, removed with                         a cold snare. Resected and retrieved.                        - One 4 mm polyp in the sigmoid colon, removed with a                         cold snare. Resected and retrieved. Recommendation:        - Discharge patient to home.                        -  Resume previous diet.                        - Continue present medications.                        - Await pathology results.                        - Repeat colonoscopy in 5 years for surveillance. Procedure Code(s):     --- Professional ---                        [phone], Colonoscopy, flexible; with removal of                         tumor(s), polyp(s), or other lesion(s) by snare                         technique Diagnosis Code(s):     --- Professional ---                         Z86.010, Personal history of colonic polyps                        D12.3, Benign neoplasm of transverse colon (hepatic                         flexure or splenic flexure) CPT copyright 2022 American Medical Association. All rights reserved. The codes documented in this report are preliminary and upon coder review may  be revised to meet current compliance requirements. Lucilla Lame MD, MD 09/10/2022 9:02:31 AM This report has been signed electronically. Number of Addenda: 0 Note Initiated On: 09/10/2022 8:31 AM Scope Withdrawal Time: 0 hours 8 minutes 54 seconds  Total Procedure Duration: 0 hours 12 minutes 27 seconds  Estimated Blood Loss:  Estimated blood loss: none.      [facility]

## 2022-09-10 NOTE — Anesthesia Postprocedure Evaluation (Signed)
Anesthesia Post Note  Patient: Linda Guzman  Procedure(s) Performed: COLONOSCOPY WITH PROPOFOL  Patient location during evaluation: Endoscopy Anesthesia Type: General Level of consciousness: awake and alert Pain management: pain level controlled Vital Signs Assessment: post-procedure vital signs reviewed and stable Respiratory status: spontaneous breathing, nonlabored ventilation, respiratory function stable and patient connected to nasal cannula oxygen Cardiovascular status: blood pressure returned to baseline and stable Postop Assessment: no apparent nausea or vomiting Anesthetic complications: no   No notable events documented.   Last Vitals:  Vitals:   09/10/22 0904 09/10/22 0925  BP: 107/73 112/82  Pulse: 88 73  Resp: 16 15  Temp: (!) 35.7 C   SpO2: 100% 100%    Last Pain:  Vitals:   09/10/22 0925  TempSrc:   PainSc: 0-No pain                 Arita Miss

## 2022-09-10 NOTE — Transfer of Care (Signed)
Immediate Anesthesia Transfer of Care Note  Patient: Linda Guzman  Procedure(s) Performed: COLONOSCOPY WITH PROPOFOL  Patient Location: PACU and Endoscopy Unit  Anesthesia Type:General  Level of Consciousness: sedated  Airway & Oxygen Therapy: Patient Spontanous Breathing  Post-op Assessment: Report given to RN and Post -op Vital signs reviewed and stable  Post vital signs: Reviewed and stable  Last Vitals:  Vitals Value Taken Time  BP    Temp    Pulse 70 09/10/22 0905  Resp 27 09/10/22 0905  SpO2 100 % 09/10/22 0905  Vitals shown include unvalidated device data.  Last Pain:  Vitals:   09/10/22 0757  TempSrc: Temporal  PainSc: 0-No pain         Complications: No notable events documented.

## 2022-09-10 NOTE — Anesthesia Preprocedure Evaluation (Signed)
Anesthesia Evaluation  Patient identified by MRN, date of birth, ID band Patient awake  General Assessment Comment:  Patient has documented intolerance of propofol (chart says it caused prolonged weakness and tiredness for two weeks after an endoscopy in 2020). I asked her about this via interpreter and she denies this.  Reviewed: Allergy & Precautions, NPO status , Patient's Chart, lab work & pertinent test results  History of Anesthesia Complications Negative for: history of anesthetic complications  Airway Mallampati: II  TM Distance: >3 FB Neck ROM: Full    Dental no notable dental hx. (+) Teeth Intact   Pulmonary neg pulmonary ROS, neg sleep apnea, neg COPD, Patient abstained from smoking.Not current smoker   Pulmonary exam normal breath sounds clear to auscultation       Cardiovascular Exercise Tolerance: Good METShypertension, (-) CAD and (-) Past MI (-) dysrhythmias  Rhythm:Regular Rate:Normal - Systolic murmurs    Neuro/Psych negative neurological ROS  negative psych ROS   GI/Hepatic ,neg GERD  ,,(+)     (-) substance abuse    Endo/Other  neg diabetes    Renal/GU negative Renal ROS     Musculoskeletal   Abdominal   Peds  Hematology   Anesthesia Other Findings Past Medical History: No date: Hypertension  Reproductive/Obstetrics                              Anesthesia Physical Anesthesia Plan  ASA: 2  Anesthesia Plan: General   Post-op Pain Management: Minimal or no pain anticipated   Induction: Intravenous  PONV Risk Score and Plan: 3 and Propofol infusion, TIVA and Ondansetron  Airway Management Planned: Nasal Cannula  Additional Equipment: None  Intra-op Plan:   Post-operative Plan:   Informed Consent: I have reviewed the patients History and Physical, chart, labs and discussed the procedure including the risks, benefits and alternatives for the proposed  anesthesia with the patient or authorized representative who has indicated his/her understanding and acceptance.     Dental advisory given and Interpreter used for interveiw (Twi interpreter via bedside ipad)  Plan Discussed with: CRNA and Surgeon  Anesthesia Plan Comments: (Discussed risks of anesthesia with patient, including possibility of difficulty with spontaneous ventilation under anesthesia necessitating airway intervention, PONV, and rare risks such as cardiac or respiratory or neurological events, and allergic reactions. Discussed the role of CRNA in patient's perioperative care. Patient understands.)         Anesthesia Quick Evaluation

## 2022-09-10 NOTE — Anesthesia Procedure Notes (Signed)
Procedure Name: MAC Date/Time: 09/10/2022 8:42 AM  Performed by: Biagio Borg, CRNAPre-anesthesia Checklist: Emergency Drugs available, Patient identified, Suction available and Patient being monitored Patient Re-evaluated:Patient Re-evaluated prior to induction Oxygen Delivery Method: Nasal cannula

## 2022-09-11 ENCOUNTER — Encounter: Payer: Self-pay | Admitting: Gastroenterology

## 2022-09-11 LAB — SURGICAL PATHOLOGY

## 2022-09-12 ENCOUNTER — Encounter: Payer: Self-pay | Admitting: Gastroenterology

## 2022-10-02 ENCOUNTER — Other Ambulatory Visit: Payer: Self-pay

## 2022-11-04 ENCOUNTER — Encounter: Payer: Self-pay | Admitting: Internal Medicine

## 2022-11-04 ENCOUNTER — Ambulatory Visit (INDEPENDENT_AMBULATORY_CARE_PROVIDER_SITE_OTHER): Payer: Medicare Other | Admitting: Internal Medicine

## 2022-11-04 VITALS — BP 130/88 | HR 72 | Ht 66.0 in | Wt 179.0 lb

## 2022-11-04 DIAGNOSIS — E7800 Pure hypercholesterolemia, unspecified: Secondary | ICD-10-CM | POA: Diagnosis not present

## 2022-11-04 DIAGNOSIS — Z6828 Body mass index (BMI) 28.0-28.9, adult: Secondary | ICD-10-CM | POA: Diagnosis not present

## 2022-11-04 DIAGNOSIS — E663 Overweight: Secondary | ICD-10-CM

## 2022-11-04 DIAGNOSIS — E1165 Type 2 diabetes mellitus with hyperglycemia: Secondary | ICD-10-CM | POA: Diagnosis not present

## 2022-11-04 DIAGNOSIS — I10 Essential (primary) hypertension: Secondary | ICD-10-CM | POA: Diagnosis not present

## 2022-11-04 LAB — POCT GLYCOSYLATED HEMOGLOBIN (HGB A1C): HbA1c, POC (controlled diabetic range): 6.4 % (ref 0.0–7.0)

## 2022-11-04 NOTE — Assessment & Plan Note (Signed)
Controlled on amlodipine and HCTZ Reinforced DASH diet and exercise for weight loss

## 2022-11-04 NOTE — Assessment & Plan Note (Signed)
Encouraged her to consume a low-fat diet Continue atorvastatin

## 2022-11-04 NOTE — Assessment & Plan Note (Signed)
POCT A1c 6.4% Encourage low-carb diet and exercise for weight loss Encouraged her to get a routine eye exam Encouraged routine foot exam Encouraged her to get a flu shot in the fall Pneumovax UTD Encouraged her to get her COVID vaccines

## 2022-11-04 NOTE — Addendum Note (Signed)
Addended by: Jearld Fenton on: 11/04/2022 09:39 AM   Modules accepted: Orders

## 2022-11-04 NOTE — Assessment & Plan Note (Signed)
Encourage diet and exercise for weight loss 

## 2022-11-04 NOTE — Progress Notes (Signed)
Subjective:    Patient ID: Linda Guzman, female    DOB: 1956-06-23, 67 y.o.   MRN: [id number]  HPI  Patient presents to clinic today for 53-month follow-up of HTN, HLD and DM2.  HTN: Her BP today is 130/88.  She is taking Amlodipine and HCTZ as prescribed.  ECG from 07/2018 reviewed.  HLD: Her last LDL was 69, triglycerides 80, 07/2022. She denies myalgias on Atorvastatin. She has been trying to consume a low fat diet.  DM 2: Her last A1C was 6.7%, 07/2022.  She is not taking any cholesterol-lowering medication at this time.  She does not check her sugars.  Her last eye exam was > 1 year ago.  Flu 04/2022.  Pneumovax 07/2021.  COVID never.  Review of Systems     Past Medical History:  Diagnosis Date   Hypertension     Current Outpatient Medications  Medication Sig Dispense Refill   amLODipine (NORVASC) 10 MG tablet Take 1 tablet (10 mg total) by mouth daily. 90 tablet 1   atorvastatin (LIPITOR) 10 MG tablet Take 1 tablet (10 mg total) by mouth daily. 90 tablet 1   cholecalciferol (VITAMIN D) 1000 units tablet Take 1,000 Units by mouth daily.     hydrochlorothiazide (HYDRODIURIL) 50 MG tablet Take 1 tablet (50 mg total) by mouth daily. 90 tablet 1   Magnesium Oxide 250 MG TABS Take 250 mg by mouth daily.      No current facility-administered medications for this visit.    Allergies  Allergen Reactions   Chloroquine Itching   Propofol     Patient had prolonged fatigue, weakness several weeks after propofol use for colonoscopy 2020. - Patient is not truly allergic, but this med should be used with caution in future    Family History  Problem Relation Age of Onset   Hypertension Mother    Stroke Sister    Breast cancer Neg Hx     Social History   Socioeconomic History   Marital status: Married    Spouse name: Not on file   Number of children: Not on file   Years of education: Not on file   Highest education level: Not on file  Occupational History   Not on file   Tobacco Use   Smoking status: Never   Smokeless tobacco: Never  Vaping Use   Vaping Use: Never used  Substance and Sexual Activity   Alcohol use: No   Drug use: No   Sexual activity: Yes    Birth control/protection: Post-menopausal  Other Topics Concern   Not on file  Social History Narrative   Not on file   Social Determinants of Health   Financial Resource Strain: Not on file  Food Insecurity: Not on file  Transportation Needs: Not on file  Physical Activity: Not on file  Stress: Not on file  Social Connections: Not on file  Intimate Partner Violence: Not on file     Constitutional: Denies fever, malaise, fatigue, headache or abrupt weight changes.  HEENT: Denies eye pain, eye redness, ear pain, ringing in the ears, wax buildup, runny nose, nasal congestion, bloody nose, or sore throat. Respiratory: Denies difficulty breathing, shortness of breath, cough or sputum production.   Cardiovascular: Denies chest pain, chest tightness, palpitations or swelling in the hands or feet.  Gastrointestinal: Patient reports increased thirst.  Denies abdominal pain, bloating, constipation, diarrhea or blood in the stool.  GU: Denies urgency, frequency, pain with urination, burning sensation, blood in urine, odor or  discharge. Musculoskeletal: Denies decrease in range of motion, difficulty with gait, muscle pain or joint pain and swelling.  Skin: Denies redness, rashes, lesions or ulcercations.  Neurological: Denies dizziness, difficulty with memory, difficulty with speech or problems with balance and coordination.  Psych: Denies anxiety, depression, SI/HI.  No other specific complaints in a complete review of systems (except as listed in HPI above).  Objective:   Physical Exam  BP 130/88   Pulse 72   Ht 5\' 6"  (1.676 m)   Wt 179 lb (81.2 kg)   SpO2 100%   BMI 28.89 kg/m   Wt Readings from Last 3 Encounters:  09/10/22 176 lb 9.6 oz (80.1 kg)  08/02/22 180 lb 3.2 oz (81.7 kg)   07/30/22 182 lb (82.6 kg)    General: Appears her stated age, overweight in NAD. Skin: Warm, dry and intact. No ulcerations noted. HEENT: Head: normal shape and size; Eyes: sclera white, no icterus, conjunctiva pink, PERRLA and EOMs intact;  Cardiovascular: Normal rate and rhythm. S1,S2 noted.  No murmur, rubs or gallops noted. No JVD or BLE edema. No carotid bruits noted. Pulmonary/Chest: Normal effort and positive vesicular breath sounds. No respiratory distress. No wheezes, rales or ronchi noted.  Neurological: Alert and oriented. Coordination normal.  Psychiatric: Mood and affect normal. Behavior is normal. Judgment and thought content normal.     BMET    Component Value Date/Time   NA 142 07/30/2022 0851   K 3.4 (L) 07/30/2022 0851   CL 101 07/30/2022 0851   CO2 29 07/30/2022 0851   GLUCOSE 113 (H) 07/30/2022 0851   BUN 8 07/30/2022 0851   CREATININE 0.75 07/30/2022 0851   CALCIUM 9.9 07/30/2022 0851   GFRNONAA 60 07/04/2020 0837   GFRAA 70 07/04/2020 0837    Lipid Panel     Component Value Date/Time   CHOL 150 07/30/2022 0851   TRIG 80 07/30/2022 0851   HDL 65 07/30/2022 0851   CHOLHDL 2.3 07/30/2022 0851   VLDL 20 01/08/2017 0904   LDLCALC 69 07/30/2022 0851    CBC    Component Value Date/Time   WBC 5.7 07/30/2022 0851   RBC 5.07 07/30/2022 0851   HGB 14.0 07/30/2022 0851   HCT 43.3 07/30/2022 0851   PLT 300 07/30/2022 0851   MCV 85.4 07/30/2022 0851   MCH 27.6 07/30/2022 0851   MCHC 32.3 07/30/2022 0851   RDW 13.7 07/30/2022 0851   LYMPHSABS 2,227 07/04/2020 0837   MONOABS 0.4 07/21/2018 1157   EOSABS 128 07/04/2020 0837   BASOSABS 43 07/04/2020 0837    Hgb A1C Lab Results  Component Value Date   HGBA1C 6.7 (H) 07/30/2022            Assessment & Plan:      RTC in 3 months, follow-up chronic conditions Webb Silversmith, NP

## 2022-11-04 NOTE — Patient Instructions (Signed)

## 2022-11-26 ENCOUNTER — Other Ambulatory Visit: Payer: Self-pay | Admitting: Internal Medicine

## 2022-11-26 DIAGNOSIS — I10 Essential (primary) hypertension: Secondary | ICD-10-CM

## 2022-11-26 DIAGNOSIS — E785 Hyperlipidemia, unspecified: Secondary | ICD-10-CM

## 2022-11-26 NOTE — Telephone Encounter (Signed)
Requested Prescriptions  Pending Prescriptions Disp Refills   hydrochlorothiazide (HYDRODIURIL) 50 MG tablet [Pharmacy Med Name: HYDROCHLOROTHIAZIDE 50 MG TAB] 30 tablet 5    Sig: TAKE 1 TABLET BY MOUTH EVERY DAY     Cardiovascular: Diuretics - Thiazide Failed - 11/26/2022  2:27 AM      Failed - K in normal range and within 180 days    Potassium  Date Value Ref Range Status  07/30/2022 3.4 (L) 3.5 - 5.3 mmol/L Final         Passed - Cr in normal range and within 180 days    Creat  Date Value Ref Range Status  07/30/2022 0.75 0.50 - 1.05 mg/dL Final         Passed - Na in normal range and within 180 days    Sodium  Date Value Ref Range Status  07/30/2022 142 135 - 146 mmol/L Final         Passed - Last BP in normal range    BP Readings from Last 1 Encounters:  11/04/22 130/88         Passed - Valid encounter within last 6 months    Recent Outpatient Visits           3 weeks ago Essential hypertension   [place] [facility] [place], [place] W, NP   3 months ago Type 2 diabetes mellitus with hyperglycemia, without long-term current use of insulin [facility])   [place] [facility] [place], Salvadore Oxford, NP   3 months ago Encounter for general adult medical examination with abnormal findings   [place] [facility] [place], Salvadore Oxford, NP   6 months ago Prediabetes   [place] [facility] [place], Salvadore Oxford, NP   1 year ago Encounter for general adult medical examination with abnormal findings   [place] [place], Salvadore Oxford, NP       Future Appointments             In 2 months Baity, Salvadore Oxford, NP [place] [facility]             amLODipine (NORVASC) 10 MG tablet [Pharmacy Med Name: AMLODIPINE BESYLATE 10 MG TAB] 30 tablet 5    Sig: TAKE 1 TABLET BY MOUTH EVERY DAY     Cardiovascular: Calcium Channel Blockers 2 Passed - 11/26/2022  2:27 AM      Passed - Last BP  in normal range    BP Readings from Last 1 Encounters:  11/04/22 130/88         Passed - Last Heart Rate in normal range    Pulse Readings from Last 1 Encounters:  11/04/22 72         Passed - Valid encounter within last 6 months    Recent Outpatient Visits           3 weeks ago Essential hypertension   [facility] [place], [place] W, NP   3 months ago Type 2 diabetes mellitus with hyperglycemia, without long-term current use of insulin [facility])   [place] [facility] [place], Salvadore Oxford, NP   3 months ago Encounter for general adult medical examination with abnormal findings   [place] [facility] [place], Salvadore Oxford, NP   6 months ago Prediabetes    [facility] [place], Salvadore Oxford, NP   1 year ago  Encounter for general adult medical examination with abnormal findings   [place] [facility] [place], Salvadore Oxford, NP       Future Appointments             In 2 months Baity, Salvadore Oxford, NP [place] [facility]             atorvastatin (LIPITOR) 10 MG tablet [Pharmacy Med Name: ATORVASTATIN 10 MG TABLET] 30 tablet 5    Sig: TAKE 1 TABLET BY MOUTH EVERY DAY     Cardiovascular:  Antilipid - Statins Failed - 11/26/2022  2:27 AM      Failed - Lipid Panel in normal range within the last 12 months    Cholesterol  Date Value Ref Range Status  07/30/2022 150 <200 mg/dL Final   LDL Cholesterol (Calc)  Date Value Ref Range Status  07/30/2022 69 mg/dL (calc) Final    Comment:    Reference range: <100 . Desirable range <100 mg/dL for primary prevention;   <70 mg/dL for patients with CHD or diabetic patients  with > or = 2 CHD risk factors. Marland Kitchen LDL-C is now calculated using the Martin-Hopkins  calculation, which is a validated novel method providing  better accuracy than the Friedewald equation in the  estimation of LDL-C.  Horald Pollen et al. Lenox Ahr. 2482;500(37):  2061-2068  (http://education.[employer].com/faq/FAQ164)    HDL  Date Value Ref Range Status  07/30/2022 65 > OR = 50 mg/dL Final   Triglycerides  Date Value Ref Range Status  07/30/2022 80 <150 mg/dL Final         Passed - Patient is not pregnant      Passed - Valid encounter within last 12 months    Recent Outpatient Visits           3 weeks ago Essential hypertension   [place] [facility] [place], [place] W, NP   3 months ago Type 2 diabetes mellitus with hyperglycemia, without long-term current use of insulin [facility])   [place] [facility] [place], Salvadore Oxford, NP   3 months ago Encounter for general adult medical examination with abnormal findings   [place] [facility] [place], Salvadore Oxford, NP   6 months ago Prediabetes   [place] [facility] [place], Salvadore Oxford, NP   1 year ago Encounter for general adult medical examination with abnormal findings   [place] [facility] [place], Salvadore Oxford, NP       Future Appointments             In 2 months Baity, Salvadore Oxford, NP [place] [facility]

## 2022-12-10 ENCOUNTER — Encounter: Payer: Self-pay | Admitting: Internal Medicine

## 2022-12-10 ENCOUNTER — Ambulatory Visit (INDEPENDENT_AMBULATORY_CARE_PROVIDER_SITE_OTHER): Payer: Medicare Other | Admitting: Internal Medicine

## 2022-12-10 VITALS — BP 113/73 | HR 73 | Temp 98.4°F | Resp 17 | Ht 66.0 in | Wt 179.6 lb

## 2022-12-10 DIAGNOSIS — Z6828 Body mass index (BMI) 28.0-28.9, adult: Secondary | ICD-10-CM | POA: Diagnosis not present

## 2022-12-10 DIAGNOSIS — E663 Overweight: Secondary | ICD-10-CM

## 2022-12-10 DIAGNOSIS — G8929 Other chronic pain: Secondary | ICD-10-CM | POA: Diagnosis not present

## 2022-12-10 DIAGNOSIS — M5442 Lumbago with sciatica, left side: Secondary | ICD-10-CM

## 2022-12-10 MED ORDER — PREDNISONE 10 MG PO TABS: ORAL_TABLET | ORAL | 0 refills | Status: DC

## 2022-12-10 NOTE — Assessment & Plan Note (Signed)
X-ray reviewed with patient Will obtain MRI lumbar spine Rx for Pred taper x 9 days Stretching exercises given, has completed PT in the past

## 2022-12-10 NOTE — Assessment & Plan Note (Signed)
Encourage weight loss as this can help reduce back pain 

## 2022-12-10 NOTE — Patient Instructions (Signed)

## 2022-12-10 NOTE — Progress Notes (Signed)
Subjective:    Patient ID: Linda Guzman, female    DOB: 11-23-1955, 67 y.o.   MRN: [id number]  HPI  Patient presents to clinic today with complaint of low back pain .  This started a few years ago after she fell off her bed and hit her leg on the dresser.  She describes the pain as sharp and stabbing.  The pain as well as a burning sensation radiates down her left leg into her ankle.  The pain is worse with prolonged standing or walking.  She denies associated numbness, tingling or weakness of the left lower extremity.  She denies loss of bowel or bladder control.  She has tried physical therapy in the past for this.  She had an x-ray of her lumbar spine 01/2018 which showed:  IMPRESSION: Mild chronic appearing anterior wedging of the L5 vertebral body. No other fracture. No spondylolisthesis. There is disc space narrowing at L5-S1. Other disc spaces appear unremarkable. There are areas of facet arthropathy in the lower lumbar spine.  She has tried Tylenol and Ibuprofen OTC with minimal relief of symptoms.  Review of Systems     Past Medical History:  Diagnosis Date   Hypertension     Current Outpatient Medications  Medication Sig Dispense Refill   amLODipine (NORVASC) 10 MG tablet TAKE 1 TABLET BY MOUTH EVERY DAY 90 tablet 1   atorvastatin (LIPITOR) 10 MG tablet TAKE 1 TABLET BY MOUTH EVERY DAY 90 tablet 1   cholecalciferol (VITAMIN D) 1000 units tablet Take 1,000 Units by mouth daily.     hydrochlorothiazide (HYDRODIURIL) 50 MG tablet TAKE 1 TABLET BY MOUTH EVERY DAY 90 tablet 1   Magnesium Oxide 250 MG TABS Take 250 mg by mouth daily.      No current facility-administered medications for this visit.    Allergies  Allergen Reactions   Chloroquine Itching   Propofol     Patient had prolonged fatigue, weakness several weeks after propofol use for colonoscopy 2020. - Patient is not truly allergic, but this med should be used with caution in future    Family History  Problem  Relation Age of Onset   Hypertension Mother    Stroke Sister    Breast cancer Neg Hx     Social History   Socioeconomic History   Marital status: Married    Spouse name: Not on file   Number of children: Not on file   Years of education: Not on file   Highest education level: Not on file  Occupational History   Not on file  Tobacco Use   Smoking status: Never   Smokeless tobacco: Never  Vaping Use   Vaping Use: Never used  Substance and Sexual Activity   Alcohol use: No   Drug use: No   Sexual activity: Yes    Birth control/protection: Post-menopausal  Other Topics Concern   Not on file  Social History Narrative   Not on file   Social Determinants of Health   Financial Resource Strain: Not on file  Food Insecurity: Not on file  Transportation Needs: Not on file  Physical Activity: Not on file  Stress: Not on file  Social Connections: Not on file  Intimate Partner Violence: Not on file     Constitutional: Denies fever, malaise, fatigue, headache or abrupt weight changes.  Respiratory: Denies difficulty breathing, shortness of breath, cough or sputum production.   Cardiovascular: Denies chest pain, chest tightness, palpitations or swelling in the hands or feet.  Gastrointestinal: Denies loss of bowel control, abdominal pain, bloating, constipation, diarrhea or blood in the stool.  GU: Denies loss of bladder control, urgency, frequency, pain with urination, burning sensation, blood in urine, odor or discharge. Musculoskeletal: Patient reports low back pain, left leg pain.  Denies decrease in range of motion, difficulty with gait, muscle pain or joint swelling.  Skin: Denies redness, rashes, lesions or ulcercations.  Neurological: Patient reports burning sensation in left leg.  Denies dizziness, numbness, tingling, weakness or problems with balance and coordination.    No other specific complaints in a complete review of systems (except as listed in HPI  above).  Objective:   Physical Exam   BP 113/73 (BP Location: Right Arm, Patient Position: Sitting, Cuff Size: Normal)   Pulse 73   Temp 98.4 F (36.9 C) (Oral)   Resp 17   Ht  (1.676 m)   Wt 179 lb 9.6 oz (81.5 kg)   SpO2 100%   BMI 28.99 kg/m   Wt Readings from Last 3 Encounters:  11/04/22 179 lb (81.2 kg)  09/10/22 176 lb 9.6 oz (80.1 kg)  08/02/22 180 lb 3.2 oz (81.7 kg)    General: Appears her stated age, obese, in NAD. Cardiovascular: Normal rate and rhythm. S1,S2 noted.  No murmur, rubs or gallops noted.  Pulmonary/Chest: Normal effort and positive vesicular breath sounds. No respiratory distress. No wheezes, rales or ronchi noted.  Musculoskeletal: Normal flexion, rotation and lateral bending of the spine.  Decreased extension secondary to pain.  No bony tenderness noted over the lumbar spine but has pain with palpation of the left SI joint.  She has decreased strength with resistance to flexion of the left knee but otherwise strength 5/5 BLE.  She has difficulty standing on her tiptoes.  She is able to stand on her heels.  Gait slow and steady without device. Neurological: Alert and oriented.  Positive SLR on the left at 30 degrees.    BMET    Component Value Date/Time   NA 142 07/30/2022 0851   K 3.4 (L) 07/30/2022 0851   CL 101 07/30/2022 0851   CO2 29 07/30/2022 0851   GLUCOSE 113 (H) 07/30/2022 0851   BUN 8 07/30/2022 0851   CREATININE 0.75 07/30/2022 0851   CALCIUM 9.9 07/30/2022 0851   GFRNONAA 60 07/04/2020 0837   GFRAA 70 07/04/2020 0837    Lipid Panel     Component Value Date/Time   CHOL 150 07/30/2022 0851   TRIG 80 07/30/2022 0851   HDL 65 07/30/2022 0851   CHOLHDL 2.3 07/30/2022 0851   VLDL 20 01/08/2017 0904   LDLCALC 69 07/30/2022 0851    CBC    Component Value Date/Time   WBC 5.7 07/30/2022 0851   RBC 5.07 07/30/2022 0851   HGB 14.0 07/30/2022 0851   HCT 43.3 07/30/2022 0851   PLT 300 07/30/2022 0851   MCV 85.4 07/30/2022  0851   MCH 27.6 07/30/2022 0851   MCHC 32.3 07/30/2022 0851   RDW 13.7 07/30/2022 0851   LYMPHSABS 2,227 07/04/2020 0837   MONOABS 0.4 07/21/2018 1157   EOSABS 128 07/04/2020 0837   BASOSABS 43 07/04/2020 0837    Hgb A1C Lab Results  Component Value Date   HGBA1C 6.4 11/04/2022           Assessment & Plan:    RTC in 2 months for follow-up of chronic conditions Nicki Reaper, NP

## 2022-12-23 ENCOUNTER — Telehealth: Payer: Self-pay | Admitting: Internal Medicine

## 2022-12-23 ENCOUNTER — Ambulatory Visit: Payer: Self-pay

## 2022-12-23 MED ORDER — DICLOFENAC SODIUM 75 MG PO TBEC: 75.0000 mg | DELAYED_RELEASE_TABLET | Freq: Two times a day (BID) | ORAL | 0 refills | Status: DC

## 2022-12-23 NOTE — Telephone Encounter (Signed)
It looks like she seen 12/10/2022 for back pain.

## 2022-12-23 NOTE — Telephone Encounter (Signed)
Prednisone is not meant to be taken long-term.  I can start her on something like diclofenac or meloxicam in the place of it but she should not take any anti-inflammatories OTC.  Has her MRI been scheduled?

## 2022-12-23 NOTE — Telephone Encounter (Signed)
Medication Refill - Medication: predniSONE (DELTASONE) 10 MG tablet [  Pt's husband is asking if the quantity can be increased, because the patient is in pain (not much husband says)  Has the patient contacted their pharmacy? Yes.   (Agent: If no, request that the patient contact the pharmacy for the refill. If patient does not wish to contact the pharmacy document the reason why and proceed with request.) (Agent: If yes, when and what did the pharmacy advise?)  Preferred Pharmacy (with phone number or street name): [id number] [place], [place] - [address] [place] [address] Phone: [phone] Fax: [phone]  Has the patient been seen for an appointment in the last year OR does the patient have an upcoming appointment? Yes.    Agent: Please be advised that RX refills may take up to 3 business days. We ask that you follow-up with your pharmacy.            Chief Complaint: Continued back pain, radiates down left leg. MRI has not been scheduled yet. Symptoms: Pain is mild today per husband. Frequency:  Pertinent Negatives: Patient denies  Disposition: [] ED /[] Urgent Care (no appt availability in office) / [] Appointment(In office/virtual)/ []  [place] [facility]/ [] Home Care/ [] Refused Recommended Disposition /[] [place] [facility]/ [x]  Follow-up with PCP Additional Notes: Please advise pt.  Answer Assessment - Initial Assessment Questions 1. ONSET: "When did the pain begin?"      *No Answer* 2. LOCATION: "Where does it hurt?" (upper, mid or lower back)     Side of back and down left leg 3. SEVERITY: "How bad is the pain?"  (e.g., Scale 1-10; mild, moderate, or severe)   - MILD (1-3): Doesn't interfere with normal activities.    - MODERATE (4-7): Interferes with normal activities or awakens from sleep.    - SEVERE (8-10): Excruciating pain, unable to do any normal activities.      Mild 4. PATTERN: "Is the pain constant?" (e.g., yes, no; constant,  intermittent)      Comes and goes 5. RADIATION: "Does the pain shoot into your legs or somewhere else?"     Left leg 6. CAUSE:  "What do you think is causing the back pain?"      Unsure 7. BACK OVERUSE:  "Any recent lifting of heavy objects, strenuous work or exercise?"     No 8. MEDICINES: "What have you taken so far for the pain?" (e.g., nothing, acetaminophen, NSAIDS)     Prednisone 9. NEUROLOGIC SYMPTOMS: "Do you have any weakness, numbness, or problems with bowel/bladder control?"     No 10. OTHER SYMPTOMS: "Do you have any other symptoms?" (e.g., fever, abdomen pain, burning with urination, blood in urine)       No 11. PREGNANCY: "Is there any chance you are pregnant?" "When was your last menstrual period?"       No  Protocols used: Back Pain-A-AH

## 2022-12-23 NOTE — Addendum Note (Signed)
Addended by: Lorre Munroe on: 12/23/2022 02:56 PM   Modules accepted: Orders

## 2022-12-23 NOTE — Telephone Encounter (Signed)
Diclofenac sent to pharmacy

## 2022-12-23 NOTE — Telephone Encounter (Signed)
This is a duplicate message.  Prednisone is not meant to be taken long-term.  Her MRI was denied by her insurance company because she needs to repeat physical therapy.  Would she prefer to repeat physical therapy or be referred to orthopedics for further evaluation of her back pain?  I can send her in meloxicam or diclofenac for her to take in place of prednisone.  Please let me know what she prefers.

## 2022-12-23 NOTE — Telephone Encounter (Signed)
Pt's husband advised.  (On DPR).  She would like to try meloxicam or diclofenac either one is fine.  [facility]    Pt would like to hold off on PT for now and see if the meloxicam/diclofenac helps.    Thanks,   -Vernona Rieger

## 2022-12-23 NOTE — Telephone Encounter (Signed)
Medication Refill - Medication: predniSONE (DELTASONE) 10 MG tablet [   Pt's husband is asking if the quantity can be increased  Has the patient contacted their pharmacy? Yes.   (Agent: If no, request that the patient contact the pharmacy for the refill. If patient does not wish to contact the pharmacy document the reason why and proceed with request.) (Agent: If yes, when and what did the pharmacy advise?)  Preferred Pharmacy (with phone number or street name):  [id number] [place] [place] - [address]  [address] [place] [address]  Phone: [phone] Fax: [phone]   Has the patient been seen for an appointment in the last year OR does the patient have an upcoming appointment? Yes.    Agent: Please be advised that RX refills may take up to 3 business days. We ask that you follow-up with your pharmacy.

## 2023-01-23 ENCOUNTER — Telehealth: Payer: Self-pay | Admitting: Internal Medicine

## 2023-01-23 MED ORDER — MELOXICAM 15 MG PO TABS: 15.0000 mg | ORAL_TABLET | Freq: Every day | ORAL | 0 refills | Status: DC

## 2023-01-23 NOTE — Telephone Encounter (Signed)
diclofenac (VOLTAREN) 75 MG EC tablet  please call pt husband, Oti, Awere,  pt  was given this med and can not sleep at all. Pt stopped med last nite and slept very well. Pls call husband, agent was sending message directly to PCP as communication  is enough to speak with him regarding, but thought transfer to NT would be more difficult. [phone]

## 2023-01-23 NOTE — Addendum Note (Signed)
Addended by: Lorre Munroe on: 01/23/2023 01:48 PM   Modules accepted: Orders

## 2023-01-23 NOTE — Telephone Encounter (Signed)
Meloxicam sent to pharmacy.

## 2023-01-23 NOTE — Telephone Encounter (Signed)
Pt reports dicolfenac is keeping her up at night.  She would like to try Meloxicam instead.  [facility] [place]    Thanks,   -Vernona Rieger

## 2023-01-29 ENCOUNTER — Ambulatory Visit: Payer: 59 | Admitting: Internal Medicine

## 2023-02-07 ENCOUNTER — Ambulatory Visit: Payer: 59 | Admitting: Internal Medicine

## 2023-02-07 DIAGNOSIS — H35371 Puckering of macula, right eye: Secondary | ICD-10-CM | POA: Diagnosis not present

## 2023-02-07 DIAGNOSIS — H2513 Age-related nuclear cataract, bilateral: Secondary | ICD-10-CM | POA: Diagnosis not present

## 2023-02-07 DIAGNOSIS — H40013 Open angle with borderline findings, low risk, bilateral: Secondary | ICD-10-CM | POA: Diagnosis not present

## 2023-02-18 ENCOUNTER — Encounter: Payer: Self-pay | Admitting: Internal Medicine

## 2023-02-18 ENCOUNTER — Ambulatory Visit (INDEPENDENT_AMBULATORY_CARE_PROVIDER_SITE_OTHER): Payer: Medicare Other | Admitting: Internal Medicine

## 2023-02-18 VITALS — BP 110/84 | HR 84 | Temp 95.3°F | Wt 186.0 lb

## 2023-02-18 DIAGNOSIS — M85852 Other specified disorders of bone density and structure, left thigh: Secondary | ICD-10-CM | POA: Diagnosis not present

## 2023-02-18 DIAGNOSIS — E1165 Type 2 diabetes mellitus with hyperglycemia: Secondary | ICD-10-CM

## 2023-02-18 DIAGNOSIS — E6609 Other obesity due to excess calories: Secondary | ICD-10-CM

## 2023-02-18 DIAGNOSIS — E785 Hyperlipidemia, unspecified: Secondary | ICD-10-CM

## 2023-02-18 DIAGNOSIS — I10 Essential (primary) hypertension: Secondary | ICD-10-CM | POA: Diagnosis not present

## 2023-02-18 DIAGNOSIS — M5442 Lumbago with sciatica, left side: Secondary | ICD-10-CM | POA: Diagnosis not present

## 2023-02-18 DIAGNOSIS — G8929 Other chronic pain: Secondary | ICD-10-CM | POA: Diagnosis not present

## 2023-02-18 DIAGNOSIS — E1169 Type 2 diabetes mellitus with other specified complication: Secondary | ICD-10-CM

## 2023-02-18 DIAGNOSIS — Z6830 Body mass index (BMI) 30.0-30.9, adult: Secondary | ICD-10-CM

## 2023-02-18 LAB — POCT GLYCOSYLATED HEMOGLOBIN (HGB A1C): HbA1c POC (<> result, manual entry): 7 % (ref 4.0–5.6)

## 2023-02-18 NOTE — Assessment & Plan Note (Signed)
POCT A1c 7% We will check urine microalbumin Encourage low-carb diet and exercise for weight loss-handout given on diet and carbohydrates Encouraged routine eye exam Encouraged routine foot exam Encouraged her to get a flu shot in the fall Prevnar UTD She declined COVID-vaccine

## 2023-02-18 NOTE — Assessment & Plan Note (Signed)
Controlled on amlodipine and hydrochlorothiazide Reinforced DASH diet and exercise for weight loss C-Met today 

## 2023-02-18 NOTE — Patient Instructions (Signed)

## 2023-02-18 NOTE — Assessment & Plan Note (Signed)
Improved Encourage regular stretching and core strengthening Continue meloxicam

## 2023-02-18 NOTE — Progress Notes (Signed)
Subjective:    Patient ID: Linda Guzman, female    DOB: Feb 05, 1956, 67 y.o.   MRN: [id number]  HPI  Patient presents to clinic today for 3-month follow-up of chronic conditions.  HTN: Her BP today is 110/84.  She is taking amlodipine and HCTZ as prescribed.  ECG from 07/2018 reviewed.  HLD: Her last LDL was 69, triglycerides 80, 07/2022.  She denies myalgias on atorvastatin.  She tries to consume a low-fat diet.  DM 2: Her last A1c was 6.4%, 10/2022.  She is not taking any oral diabetic medication at this time.  She does not check her sugars routinely.  She checks her feet routinely.  Her last eye exam was 02/2023.  Flu 04/2022.  Prevnar 20, 07/2021.  COVID never.  Osteopenia: She is to get calcium and vitamin D OTC.  She gets daily weightbearing exercise.  Bone density from 08/2018 reviewed.  Sciatica: Mainly on her left side.  She is taking meloxicam as prescribed.  X-ray lumbar spine from 01/2018 reviewed.  She does not follow with orthopedics.  Review of Systems     Past Medical History:  Diagnosis Date   Hypertension     Current Outpatient Medications  Medication Sig Dispense Refill   amLODipine (NORVASC) 10 MG tablet TAKE 1 TABLET BY MOUTH EVERY DAY 90 tablet 1   atorvastatin (LIPITOR) 10 MG tablet TAKE 1 TABLET BY MOUTH EVERY DAY 90 tablet 1   cholecalciferol (VITAMIN D) 1000 units tablet Take 1,000 Units by mouth daily.     hydrochlorothiazide (HYDRODIURIL) 50 MG tablet TAKE 1 TABLET BY MOUTH EVERY DAY 90 tablet 1   Magnesium Oxide 250 MG TABS Take 250 mg by mouth daily.      meloxicam (MOBIC) 15 MG tablet Take 1 tablet (15 mg total) by mouth daily. 90 tablet 0   predniSONE (DELTASONE) 10 MG tablet Take 3 tabs on days 1-3, 2 tabs on days 4-6, 1 tab on days 7-9 18 tablet 0   No current facility-administered medications for this visit.    Allergies  Allergen Reactions   Chloroquine Itching   Propofol     Patient had prolonged fatigue, weakness several weeks after propofol  use for colonoscopy 2020. - Patient is not truly allergic, but this med should be used with caution in future    Family History  Problem Relation Age of Onset   Hypertension Mother    Stroke Sister    Breast cancer Neg Hx     Social History   Socioeconomic History   Marital status: Married    Spouse name: Not on file   Number of children: Not on file   Years of education: Not on file   Highest education level: Not on file  Occupational History   Not on file  Tobacco Use   Smoking status: Never   Smokeless tobacco: Never  Vaping Use   Vaping Use: Never used  Substance and Sexual Activity   Alcohol use: No   Drug use: No   Sexual activity: Yes    Birth control/protection: Post-menopausal  Other Topics Concern   Not on file  Social History Narrative   Not on file   Social Determinants of Health   Financial Resource Strain: Not on file  Food Insecurity: Not on file  Transportation Needs: Not on file  Physical Activity: Not on file  Stress: Not on file  Social Connections: Not on file  Intimate Partner Violence: Not on file     Constitutional:  Denies fever, malaise, fatigue, headache or abrupt weight changes.  HEENT: Denies eye pain, eye redness, ear pain, ringing in the ears, wax buildup, runny nose, nasal congestion, bloody nose, or sore throat. Respiratory: Denies difficulty breathing, shortness of breath, cough or sputum production.   Cardiovascular: Denies chest pain, chest tightness, palpitations or swelling in the hands or feet.  Gastrointestinal: Denies abdominal pain, bloating, constipation, diarrhea or blood in the stool.  GU: Denies urgency, frequency, pain with urination, burning sensation, blood in urine, odor or discharge. Musculoskeletal: Patient reports chronic low back pain.  Denies decrease in range of motion, difficulty with gait, muscle pain or joint swelling.  Skin: Denies redness, rashes, lesions or ulcercations.  Neurological: Patient reports  intermittent burning sensation in legs.  Denies dizziness, difficulty with memory, difficulty with speech or problems with balance and coordination.  Psych: Denies anxiety, depression, SI/HI.  No other specific complaints in a complete review of systems (except as listed in HPI above).  Objective:   Physical Exam  BP 110/84 (BP Location: Left Arm, Patient Position: Sitting, Cuff Size: Large)   Pulse 84   Temp (!) 95.3 F (35.2 C) (Temporal)   Wt 186 lb (84.4 kg)   SpO2 97%   BMI 30.02 kg/m   Wt Readings from Last 3 Encounters:  12/10/22 179 lb 9.6 oz (81.5 kg)  11/04/22 179 lb (81.2 kg)  09/10/22 176 lb 9.6 oz (80.1 kg)    General: Appears her stated age, obese, in NAD. Skin: Warm, dry and intact. No ulcerations noted. HEENT: Head: normal shape and size; Eyes: sclera white, no icterus, conjunctiva pink, PERRLA and EOMs intact;  Cardiovascular: Normal rate and rhythm. S1,S2 noted.  No murmur, rubs or gallops noted. No JVD or BLE edema. No carotid bruits noted. Pulmonary/Chest: Normal effort and positive vesicular breath sounds. No respiratory distress. No wheezes, rales or ronchi noted.  Musculoskeletal: No difficulty with gait.  Neurological: Alert and oriented. Coordination normal.     BMET    Component Value Date/Time   NA 142 07/30/2022 0851   K 3.4 (L) 07/30/2022 0851   CL 101 07/30/2022 0851   CO2 29 07/30/2022 0851   GLUCOSE 113 (H) 07/30/2022 0851   BUN 8 07/30/2022 0851   CREATININE 0.75 07/30/2022 0851   CALCIUM 9.9 07/30/2022 0851   GFRNONAA 60 07/04/2020 0837   GFRAA 70 07/04/2020 0837    Lipid Panel     Component Value Date/Time   CHOL 150 07/30/2022 0851   TRIG 80 07/30/2022 0851   HDL 65 07/30/2022 0851   CHOLHDL 2.3 07/30/2022 0851   VLDL 20 01/08/2017 0904   LDLCALC 69 07/30/2022 0851    CBC    Component Value Date/Time   WBC 5.7 07/30/2022 0851   RBC 5.07 07/30/2022 0851   HGB 14.0 07/30/2022 0851   HCT 43.3 07/30/2022 0851   PLT 300  07/30/2022 0851   MCV 85.4 07/30/2022 0851   MCH 27.6 07/30/2022 0851   MCHC 32.3 07/30/2022 0851   RDW 13.7 07/30/2022 0851   LYMPHSABS 2,227 07/04/2020 0837   MONOABS 0.4 07/21/2018 1157   EOSABS 128 07/04/2020 0837   BASOSABS 43 07/04/2020 0837    Hgb A1C Lab Results  Component Value Date   HGBA1C 6.4 11/04/2022           Assessment & Plan:     RTC in 3 months for follow-up of chronic conditions Nicki Reaper, NP

## 2023-02-18 NOTE — Assessment & Plan Note (Signed)
Continue calcium and vitamin D OTC Encouraged daily weightbearing exercise 

## 2023-02-18 NOTE — Assessment & Plan Note (Signed)
Encourage diet and exercise for weight loss 

## 2023-02-18 NOTE — Assessment & Plan Note (Signed)
C-Met and lipid profile today Encouraged her to consume a low-fat diet Continue atorvastatin 

## 2023-02-19 LAB — LIPID PANEL
Cholesterol: 148 mg/dL (ref <200)
HDL: 69 mg/dL (ref >=50)
LDL Cholesterol (Calc): 64 mg/dL (calc)
Non-HDL Cholesterol (Calc): 79 mg/dL (calc) (ref <130)
Total CHOL/HDL Ratio: 2.1 (calc) (ref <5.0)
Triglycerides: 70 mg/dL (ref <150)

## 2023-02-19 LAB — COMPLETE METABOLIC PANEL WITH GFR
AG Ratio: 1.4 (calc) (ref 1.0–2.5)
ALT: 30 U/L — ABNORMAL HIGH (ref 6–29)
AST: 25 U/L (ref 10–35)
Albumin: 4.5 g/dL (ref 3.6–5.1)
Alkaline phosphatase (APISO): 78 U/L (ref 37–153)
BUN: 9 mg/dL (ref 7–25)
CO2: 29 mmol/L (ref 20–32)
Calcium: 9.9 mg/dL (ref 8.6–10.4)
Chloride: 101 mmol/L (ref 98–110)
Creat: 0.78 mg/dL (ref 0.50–1.05)
Globulin: 3.3 g/dL (calc) (ref 1.9–3.7)
Glucose, Bld: 132 mg/dL — ABNORMAL HIGH (ref 65–99)
Potassium: 3.2 mmol/L — ABNORMAL LOW (ref 3.5–5.3)
Sodium: 143 mmol/L (ref 135–146)
Total Bilirubin: 0.5 mg/dL (ref 0.2–1.2)
Total Protein: 7.8 g/dL (ref 6.1–8.1)
eGFR: 84 mL/min/{1.73_m2} (ref >=60)

## 2023-02-19 LAB — CBC
HCT: 44.6 % (ref 35.0–45.0)
Hemoglobin: 14.1 g/dL (ref 11.7–15.5)
MCH: 27.2 pg (ref 27.0–33.0)
MCHC: 31.6 g/dL — ABNORMAL LOW (ref 32.0–36.0)
MCV: 85.9 fL (ref 80.0–100.0)
MPV: 11 fL (ref 7.5–12.5)
Platelets: 258 10*3/uL (ref 140–400)
RBC: 5.19 10*6/uL — ABNORMAL HIGH (ref 3.80–5.10)
RDW: 13.3 % (ref 11.0–15.0)
WBC: 6.3 10*3/uL (ref 3.8–10.8)

## 2023-02-19 LAB — MICROALBUMIN / CREATININE URINE RATIO
Creatinine, Urine: 24 mg/dL (ref 20–275)
Microalb, Ur: <0.2 mg/dL

## 2023-02-26 ENCOUNTER — Other Ambulatory Visit: Payer: Self-pay

## 2023-02-26 DIAGNOSIS — E785 Hyperlipidemia, unspecified: Secondary | ICD-10-CM

## 2023-02-26 DIAGNOSIS — I10 Essential (primary) hypertension: Secondary | ICD-10-CM

## 2023-02-27 ENCOUNTER — Other Ambulatory Visit: Payer: Self-pay

## 2023-02-27 DIAGNOSIS — I10 Essential (primary) hypertension: Secondary | ICD-10-CM

## 2023-02-27 MED ORDER — AMLODIPINE BESYLATE 10 MG PO TABS
10.0000 mg | ORAL_TABLET | Freq: Every day | ORAL | 1 refills | Status: DC
Start: 2023-02-27 — End: 2023-03-25

## 2023-02-28 ENCOUNTER — Other Ambulatory Visit: Payer: Self-pay

## 2023-02-28 DIAGNOSIS — E785 Hyperlipidemia, unspecified: Secondary | ICD-10-CM

## 2023-02-28 MED ORDER — ATORVASTATIN CALCIUM 10 MG PO TABS
10.0000 mg | ORAL_TABLET | Freq: Every day | ORAL | 1 refills | Status: DC
Start: 2023-02-28 — End: 2023-08-12

## 2023-03-07 ENCOUNTER — Other Ambulatory Visit: Payer: Self-pay | Admitting: Internal Medicine

## 2023-03-07 DIAGNOSIS — I10 Essential (primary) hypertension: Secondary | ICD-10-CM

## 2023-03-07 NOTE — Telephone Encounter (Signed)
Diclofenac discontinued on 01/23/23 and changed to Meloxicam.

## 2023-03-07 NOTE — Telephone Encounter (Signed)
Medication Refill - Medication: meloxicam (MOBIC) 15 MG tablet hydrochlorothiazide (HYDRODIURIL) 50 MG tablet diclofenac sodium 75 MG    Has the patient contacted their pharmacy? Yes.   LaShaun from [employer] is calling to do the refill request.     Preferred Pharmacy (with phone number or street name):  [facility] - [place], [place] - [address] Phone: [phone]  Fax: [phone]     Has the patient been seen for an appointment in the last year OR does the patient have an upcoming appointment? Yes.    Agent: Please be advised that RX refills may take up to 3 business days. We ask that you follow-up with your pharmacy.

## 2023-03-07 NOTE — Telephone Encounter (Signed)
Requested Prescriptions  Refused Prescriptions Disp Refills   meloxicam (MOBIC) 15 MG tablet 90 tablet 0    Sig: Take 1 tablet (15 mg total) by mouth daily.     Analgesics:  COX2 Inhibitors Failed - 03/07/2023 11:33 AM      Failed - Manual Review: Labs are only required if the patient has taken medication for more than 8 weeks.      Failed - ALT in normal range and within 360 days    ALT  Date Value Ref Range Status  02/18/2023 30 (H) 6 - 29 U/L Final         Passed - HGB in normal range and within 360 days    Hemoglobin  Date Value Ref Range Status  02/18/2023 14.1 11.7 - 15.5 g/dL Final         Passed - Cr in normal range and within 360 days    Creat  Date Value Ref Range Status  02/18/2023 0.78 0.50 - 1.05 mg/dL Final   Creatinine, Urine  Date Value Ref Range Status  02/18/2023 24 20 - 275 mg/dL Final         Passed - HCT in normal range and within 360 days    HCT  Date Value Ref Range Status  02/18/2023 44.6 35.0 - 45.0 % Final         Passed - AST in normal range and within 360 days    AST  Date Value Ref Range Status  02/18/2023 25 10 - 35 U/L Final         Passed - eGFR is 30 or above and within 360 days    GFR, Est African American  Date Value Ref Range Status  07/04/2020 70 > OR = 60 mL/min/1.66m2 Final   GFR, Est Non African American  Date Value Ref Range Status  07/04/2020 60 > OR = 60 mL/min/1.41m2 Final   eGFR  Date Value Ref Range Status  02/18/2023 84 > OR = 60 mL/min/1.51m2 Final         Passed - Patient is not pregnant      Passed - Valid encounter within last 12 months    Recent Outpatient Visits           2 weeks ago Type 2 diabetes mellitus with hyperglycemia, without long-term current use of insulin [facility])   [place] [facility] [place], [place] W, NP   2 months ago Chronic left-sided low back pain with left-sided sciatica   [place] [facility] [place], Salvadore Oxford, NP   4 months ago Essential  hypertension   [place] [facility] [place], [place] W, NP   7 months ago Type 2 diabetes mellitus with hyperglycemia, without long-term current use of insulin [facility])   [facility] [place], Salvadore Oxford, NP   7 months ago Encounter for general adult medical examination with abnormal findings   [place] [facility] [place], Salvadore Oxford, NP       Future Appointments             In 2 months [place], Salvadore Oxford, NP [place] [facility]             hydrochlorothiazide (HYDRODIURIL) 50 MG tablet 90 tablet 1    Sig: Take 1 tablet (50 mg total) by mouth daily.     Cardiovascular: Diuretics - Thiazide Failed - 03/07/2023 11:33 AM  Failed - K in normal range and within 180 days    Potassium  Date Value Ref Range Status  02/18/2023 3.2 (L) 3.5 - 5.3 mmol/L Final         Passed - Cr in normal range and within 180 days    Creat  Date Value Ref Range Status  02/18/2023 0.78 0.50 - 1.05 mg/dL Final   Creatinine, Urine  Date Value Ref Range Status  02/18/2023 24 20 - 275 mg/dL Final         Passed - Na in normal range and within 180 days    Sodium  Date Value Ref Range Status  02/18/2023 143 135 - 146 mmol/L Final         Passed - Last BP in normal range    BP Readings from Last 1 Encounters:  02/18/23 110/84         Passed - Valid encounter within last 6 months    Recent Outpatient Visits           2 weeks ago Type 2 diabetes mellitus with hyperglycemia, without long-term current use of insulin [facility])   [place] [facility] [place], [place] W, NP   2 months ago Chronic left-sided low back pain with left-sided sciatica   [place] [facility] [place], Salvadore Oxford, NP   4 months ago Essential hypertension   [place] [facility] [place], [place] W, NP   7 months ago Type 2 diabetes mellitus with hyperglycemia, without long-term current use of insulin [facility])    [place] [facility] [place], Salvadore Oxford, NP   7 months ago Encounter for general adult medical examination with abnormal findings   [place] [facility] [place], Salvadore Oxford, NP       Future Appointments             In 2 months [place], Salvadore Oxford, NP [place] [facility]

## 2023-03-24 ENCOUNTER — Other Ambulatory Visit: Payer: Self-pay | Admitting: Internal Medicine

## 2023-03-24 DIAGNOSIS — I10 Essential (primary) hypertension: Secondary | ICD-10-CM

## 2023-03-24 MED ORDER — HYDROCHLOROTHIAZIDE 50 MG PO TABS
50.0000 mg | ORAL_TABLET | Freq: Every day | ORAL | 0 refills | Status: DC
Start: 2023-03-24 — End: 2023-04-03

## 2023-03-24 MED ORDER — MELOXICAM 15 MG PO TABS: 15.0000 mg | ORAL_TABLET | Freq: Every day | ORAL | 0 refills | Status: DC

## 2023-03-25 NOTE — Telephone Encounter (Signed)
Requested Prescriptions  Pending Prescriptions Disp Refills   amLODipine (NORVASC) 10 MG tablet [Pharmacy Med Name: AMLODIPINE BESYLATE 10 MG TAB] 90 tablet 1    Sig: TAKE 1 TABLET BY MOUTH EVERY DAY     Cardiovascular: Calcium Channel Blockers 2 Passed - 03/24/2023  2:40 AM      Passed - Last BP in normal range    BP Readings from Last 1 Encounters:  02/18/23 110/84         Passed - Last Heart Rate in normal range    Pulse Readings from Last 1 Encounters:  02/18/23 84         Passed - Valid encounter within last 6 months    Recent Outpatient Visits           1 month ago Type 2 diabetes mellitus with hyperglycemia, without long-term current use of insulin [facility])   [place] [facility] [place], [place] W, NP   3 months ago Chronic left-sided low back pain with left-sided sciatica   [place] [facility] [place], Salvadore Oxford, NP   4 months ago Essential hypertension   [place] [facility] [place], [place] W, NP   7 months ago Type 2 diabetes mellitus with hyperglycemia, without long-term current use of insulin [facility])   [place] [facility] [place], Salvadore Oxford, NP   7 months ago Encounter for general adult medical examination with abnormal findings   [place] [facility] [place], Salvadore Oxford, NP       Future Appointments             In 1 month Baity, Salvadore Oxford, NP [place] [facility], [facility]

## 2023-04-02 ENCOUNTER — Other Ambulatory Visit: Payer: Self-pay | Admitting: Internal Medicine

## 2023-04-02 DIAGNOSIS — I10 Essential (primary) hypertension: Secondary | ICD-10-CM

## 2023-04-03 NOTE — Telephone Encounter (Signed)
Change of pharmacy Requested Prescriptions  Pending Prescriptions Disp Refills   hydrochlorothiazide (HYDRODIURIL) 50 MG tablet [Pharmacy Med Name: HYDROCHLOROTHIAZIDE 50 MG TAB] 90 tablet 0    Sig: TAKE 1 TABLET BY MOUTH EVERY DAY     Cardiovascular: Diuretics - Thiazide Failed - 04/02/2023  2:22 AM      Failed - K in normal range and within 180 days    Potassium  Date Value Ref Range Status  02/18/2023 3.2 (L) 3.5 - 5.3 mmol/L Final         Passed - Cr in normal range and within 180 days    Creat  Date Value Ref Range Status  02/18/2023 0.78 0.50 - 1.05 mg/dL Final   Creatinine, Urine  Date Value Ref Range Status  02/18/2023 24 20 - 275 mg/dL Final         Passed - Na in normal range and within 180 days    Sodium  Date Value Ref Range Status  02/18/2023 143 135 - 146 mmol/L Final         Passed - Last BP in normal range    BP Readings from Last 1 Encounters:  02/18/23 110/84         Passed - Valid encounter within last 6 months    Recent Outpatient Visits           1 month ago Type 2 diabetes mellitus with hyperglycemia, without long-term current use of insulin [facility])   [place] [facility] [place], [place], NP   3 months ago Chronic left-sided low back pain with left-sided sciatica   [place] [facility] [place], Salvadore Oxford, NP   5 months ago Essential hypertension   [place] [facility] [place], [place] W, NP   8 months ago Type 2 diabetes mellitus with hyperglycemia, without long-term current use of insulin [facility])   [place] [facility] [place], Salvadore Oxford, NP   8 months ago Encounter for general adult medical examination with abnormal findings   [place] [facility] [place], Salvadore Oxford, NP       Future Appointments             In 1 month Baity, Salvadore Oxford, NP  [place] [facility], [facility]

## 2023-04-22 ENCOUNTER — Other Ambulatory Visit: Payer: Self-pay | Admitting: Internal Medicine

## 2023-04-23 ENCOUNTER — Other Ambulatory Visit: Payer: Self-pay | Admitting: Internal Medicine

## 2023-04-23 DIAGNOSIS — I10 Essential (primary) hypertension: Secondary | ICD-10-CM

## 2023-04-23 NOTE — Telephone Encounter (Signed)
[facility].  Requested Prescriptions  Pending Prescriptions Disp Refills   meloxicam (MOBIC) 15 MG tablet [Pharmacy Med Name: MELOXICAM 15 MG TABLET] 90 tablet 0    Sig: TAKE 1 TABLET (15 MG TOTAL) BY MOUTH DAILY.     Analgesics:  COX2 Inhibitors Failed - 04/22/2023 12:59 AM      Failed - Manual Review: Labs are only required if the patient has taken medication for more than 8 weeks.      Failed - ALT in normal range and within 360 days    ALT  Date Value Ref Range Status  02/18/2023 30 (H) 6 - 29 U/L Final         Passed - HGB in normal range and within 360 days    Hemoglobin  Date Value Ref Range Status  02/18/2023 14.1 11.7 - 15.5 g/dL Final         Passed - Cr in normal range and within 360 days    Creat  Date Value Ref Range Status  02/18/2023 0.78 0.50 - 1.05 mg/dL Final   Creatinine, Urine  Date Value Ref Range Status  02/18/2023 24 20 - 275 mg/dL Final         Passed - HCT in normal range and within 360 days    HCT  Date Value Ref Range Status  02/18/2023 44.6 35.0 - 45.0 % Final         Passed - AST in normal range and within 360 days    AST  Date Value Ref Range Status  02/18/2023 25 10 - 35 U/L Final         Passed - eGFR is 30 or above and within 360 days    GFR, Est African American  Date Value Ref Range Status  07/04/2020 70 > OR = 60 mL/min/1.13m2 Final   GFR, Est Non African American  Date Value Ref Range Status  07/04/2020 60 > OR = 60 mL/min/1.32m2 Final   eGFR  Date Value Ref Range Status  02/18/2023 84 > OR = 60 mL/min/1.74m2 Final         Passed - Patient is not pregnant      Passed - Valid encounter within last 12 months    Recent Outpatient Visits           2 months ago Type 2 diabetes mellitus with hyperglycemia, without long-term current use of insulin [facility])   [place] [facility] [place], [place] W, NP   4 months ago Chronic left-sided low back pain with left-sided sciatica   [place] [facility] [place], Salvadore Oxford, NP   5 months ago Essential hypertension   [facility] [place], [place] W, NP   8 months ago Type 2 diabetes mellitus with hyperglycemia, without long-term current use of insulin [facility])   [place] [facility] [place], Salvadore Oxford, NP   8 months ago Encounter for general adult medical examination with abnormal findings   [place] [facility] [place], Salvadore Oxford, NP       Future Appointments             In 4 weeks [place], Salvadore Oxford, NP [place] [facility], [facility]

## 2023-04-24 NOTE — Telephone Encounter (Signed)
Requested Prescriptions  Refused Prescriptions Disp Refills   amLODipine (NORVASC) 10 MG tablet [Pharmacy Med Name: amLODIPine Besylate 10 MG Oral Tablet] 100 tablet 2    Sig: TAKE 1 TABLET BY MOUTH DAILY     Cardiovascular: Calcium Channel Blockers 2 Passed - 04/23/2023 10:14 PM      Passed - Last BP in normal range    BP Readings from Last 1 Encounters:  02/18/23 110/84         Passed - Last Heart Rate in normal range    Pulse Readings from Last 1 Encounters:  02/18/23 84         Passed - Valid encounter within last 6 months    Recent Outpatient Visits           2 months ago Type 2 diabetes mellitus with hyperglycemia, without long-term current use of insulin [facility])   [place] [facility] [place], [place] W, NP   4 months ago Chronic left-sided low back pain with left-sided sciatica   [place] [facility] [place], Salvadore Oxford, NP   5 months ago Essential hypertension   [place] [facility] [place], [place] W, NP   8 months ago Type 2 diabetes mellitus with hyperglycemia, without long-term current use of insulin [facility])   [place] [facility] [place], Salvadore Oxford, NP   8 months ago Encounter for general adult medical examination with abnormal findings   [place] [facility] [place], Salvadore Oxford, NP       Future Appointments             In 4 weeks Sam[place] Si, Salvadore Oxford, NP [place] [facility] [place], [facility]

## 2023-05-05 ENCOUNTER — Ambulatory Visit: Payer: Self-pay

## 2023-05-05 NOTE — Telephone Encounter (Signed)
Chief Complaint: Productive Cough Symptoms: moderate productive cough, clear sputum, vomiting  Frequency: comes and goes  Pertinent Negatives: Patient denies chest pain, SOB, fatigue  Disposition: [] ED /[] Urgent Care (no appt availability in office) / [x] Appointment(In office/virtual)/ []  [place] [facility]/ [] Home Care/ [] Refused Recommended Disposition /[] [facility]/ []  Follow-up with PCP Additional Notes: Spoke to patient's husband Oti (signed DPR on record). Oti stated the patient stated coughing yesterday and the coughing as increase and robitussin has not improved the cough. Patient has cough so much she is vomiting as well. Oti stated the patient has an appointment scheduled with PCP on 05/07/23 but wanted to know what medication can the patient take to help decrease the cough. Care advice was given and Oti verbalized understanding. Advised to callback if symptoms get worse.  Summary: coughing, vomiting low fever   Coughing, vomiting, low fever....scheduled an appt for this wednesday but wanted to be advised on OTC medication     Reason for Disposition  [1] Continuous (nonstop) coughing interferes with work or school AND [2] no improvement using cough treatment per Care Advice  Answer Assessment - Initial Assessment Questions 1. ONSET: "When did the cough begin?"      Last night  2. SEVERITY: "How bad is the cough today?"      Moderate  3. SPUTUM: "Describe the color of your sputum" (none, dry cough; clear, white, yellow, green)     clear 4. HEMOPTYSIS: "Are you coughing up any blood?" If so ask: "How much?" (flecks, streaks, tablespoons, etc.)     No  5. DIFFICULTY BREATHING: "Are you having difficulty breathing?" If Yes, ask: "How bad is it?" (e.g., mild, moderate, severe)    - MILD: No SOB at rest, mild SOB with walking, speaks normally in sentences, can lie down, no retractions, pulse < 100.    - MODERATE: SOB at rest, SOB with minimal exertion and prefers to sit,  cannot lie down flat, speaks in phrases, mild retractions, audible wheezing, pulse 100-120.    - SEVERE: Very SOB at rest, speaks in single words, struggling to breathe, sitting hunched forward, retractions, pulse > 120      Normal 6. FEVER: "Do you have a fever?" If Yes, ask: "What is your temperature, how was it measured, and when did it start?"     Unsure  7. CARDIAC HISTORY: "Do you have any history of heart disease?" (e.g., heart attack, congestive heart failure)      High blood  8. LUNG HISTORY: "Do you have any history of lung disease?"  (e.g., pulmonary embolus, asthma, emphysema)     No  9. PE RISK FACTORS: "Do you have a history of blood clots?" (or: recent major surgery, recent prolonged travel, bedridden)     No 10. OTHER SYMPTOMS: "Do you have any other symptoms?" (e.g., runny nose, wheezing, chest pain)       Vomiting, cough,  Protocols used: Cough - Acute Productive-A-AH

## 2023-05-07 ENCOUNTER — Ambulatory Visit (INDEPENDENT_AMBULATORY_CARE_PROVIDER_SITE_OTHER): Payer: Medicare Other | Admitting: Internal Medicine

## 2023-05-07 ENCOUNTER — Encounter: Payer: Self-pay | Admitting: Internal Medicine

## 2023-05-07 VITALS — BP 118/82 | HR 89 | Temp 96.7°F | Wt 181.0 lb

## 2023-05-07 DIAGNOSIS — U071 COVID-19: Secondary | ICD-10-CM

## 2023-05-07 DIAGNOSIS — R051 Acute cough: Secondary | ICD-10-CM

## 2023-05-07 LAB — POC COVID19 BINAXNOW: SARS Coronavirus 2 Ag: POSITIVE — AB

## 2023-05-07 MED ORDER — PROMETHAZINE-DM 6.25-15 MG/5ML PO SYRP: 5.0000 mL | ORAL_SOLUTION | Freq: Four times a day (QID) | ORAL | 0 refills | Status: DC | PRN

## 2023-05-07 MED ORDER — BENZONATATE 200 MG PO CAPS: 200.0000 mg | ORAL_CAPSULE | Freq: Two times a day (BID) | ORAL | 0 refills | Status: DC | PRN

## 2023-05-07 NOTE — Progress Notes (Signed)
Subjective:    Patient ID: Linda Guzman, female    DOB: 05/06/1956, 67 y.o.   MRN: [id number]  HPI  Patient presents to clinic today with complaint of headache, runny nose, ear pain, sore throat, and cough.  This started 4 days ago.  The headache is located in her forehead.  She describes the pain as pressure.  She is blowing clear mucus out of her nose.  She denies difficulty swallowing.  The cough is productive of clear mucus.  She denies nasal congestion, shortness of breath, chest pain, nausea, vomiting or diarrhea.  She has had fever and body aches but denies chills.  She has tried Robitussin OTC with minimal relief of symptoms.  She has not had sick contacts that she is aware of.  Review of Systems     Past Medical History:  Diagnosis Date   Hypertension     Current Outpatient Medications  Medication Sig Dispense Refill   amLODipine (NORVASC) 10 MG tablet TAKE 1 TABLET BY MOUTH EVERY DAY 90 tablet 1   atorvastatin (LIPITOR) 10 MG tablet Take 1 tablet (10 mg total) by mouth daily. 90 tablet 1   cholecalciferol (VITAMIN D) 1000 units tablet Take 1,000 Units by mouth daily.     hydrochlorothiazide (HYDRODIURIL) 50 MG tablet TAKE 1 TABLET BY MOUTH EVERY DAY 90 tablet 0   Magnesium Oxide 250 MG TABS Take 250 mg by mouth daily.      meloxicam (MOBIC) 15 MG tablet TAKE 1 TABLET (15 MG TOTAL) BY MOUTH DAILY. 90 tablet 0   No current facility-administered medications for this visit.    Allergies  Allergen Reactions   Chloroquine Itching   Propofol     Patient had prolonged fatigue, weakness several weeks after propofol use for colonoscopy 2020. - Patient is not truly allergic, but this med should be used with caution in future    Family History  Problem Relation Age of Onset   Hypertension Mother    Stroke Sister    Breast cancer Neg Hx     Social History   Socioeconomic History   Marital status: Married    Spouse name: Not on file   Number of children: Not on file    Years of education: Not on file   Highest education level: Not on file  Occupational History   Not on file  Tobacco Use   Smoking status: Never   Smokeless tobacco: Never  Vaping Use   Vaping status: Never Used  Substance and Sexual Activity   Alcohol use: No   Drug use: No   Sexual activity: Yes    Birth control/protection: Post-menopausal  Other Topics Concern   Not on file  Social History Narrative   Not on file   Social Determinants of Health   Financial Resource Strain: Not on file  Food Insecurity: Not on file  Transportation Needs: Not on file  Physical Activity: Not on file  Stress: Not on file  Social Connections: Not on file  Intimate Partner Violence: Not on file     Constitutional: Patient reports fever and headache.  Denies malaise, fatigue, or abrupt weight changes.  HEENT: Patient reports runny nose, ear pain and sore throat.  Denies eye pain, eye redness, ringing in the ears, wax buildup, nasal congestion, bloody nose. Respiratory: Patient reports cough.  Denies difficulty breathing, shortness of breath.   Cardiovascular: Denies chest pain, chest tightness, palpitations or swelling in the hands or feet.  Gastrointestinal: Denies abdominal pain, bloating, constipation,  diarrhea or blood in the stool.  GU: Denies urgency, frequency, pain with urination, burning sensation, blood in urine, odor or discharge.  No other specific complaints in a complete review of systems (except as listed in HPI above).  Objective:   Physical Exam   BP 118/82 (BP Location: Left Arm, Patient Position: Sitting, Cuff Size: Normal)   Pulse 89   Temp (!) 96.7 F (35.9 C) (Temporal)   Wt 181 lb (82.1 kg)   SpO2 96%   BMI 29.21 kg/m   Wt Readings from Last 3 Encounters:  02/18/23 186 lb (84.4 kg)  12/10/22 179 lb 9.6 oz (81.5 kg)  11/04/22 179 lb (81.2 kg)    General: Appears her stated age, appears well but in NAD. Skin: Warm, dry and intact.  HEENT: Head: normal shape  and size, no maxillary or frontal sinus tenderness noted; Eyes: sclera white, no icterus, conjunctiva pink, PERRLA and EOMs intact; Ears: Tm's gray and intact, normal light reflex; Nose: mucosa boggy and moist, septum midline; Throat/Mouth: Teeth present, mucosa erythematous and moist, no exudate, lesions or ulcerations noted.  Neck: No adenopathy noted. Cardiovascular: Normal rate and rhythm. S1,S2 noted.  No murmur, rubs or gallops noted.  Pulmonary/Chest: Normal effort and positive vesicular breath sounds. No respiratory distress. No wheezes, rales or ronchi noted.  Neurological: Alert and oriented.   BMET    Component Value Date/Time   NA 143 02/18/2023 0837   K 3.2 (L) 02/18/2023 0837   CL 101 02/18/2023 0837   CO2 29 02/18/2023 0837   GLUCOSE 132 (H) 02/18/2023 0837   BUN 9 02/18/2023 0837   CREATININE 0.78 02/18/2023 0837   CALCIUM 9.9 02/18/2023 0837   GFRNONAA 60 07/04/2020 0837   GFRAA 70 07/04/2020 0837    Lipid Panel     Component Value Date/Time   CHOL 148 02/18/2023 0837   TRIG 70 02/18/2023 0837   HDL 69 02/18/2023 0837   CHOLHDL 2.1 02/18/2023 0837   VLDL 20 01/08/2017 0904   LDLCALC 64 02/18/2023 0837    CBC    Component Value Date/Time   WBC 6.3 02/18/2023 0837   RBC 5.19 (H) 02/18/2023 0837   HGB 14.1 02/18/2023 0837   HCT 44.6 02/18/2023 0837   PLT 258 02/18/2023 0837   MCV 85.9 02/18/2023 0837   MCH 27.2 02/18/2023 0837   MCHC 31.6 (L) 02/18/2023 0837   RDW 13.3 02/18/2023 0837   LYMPHSABS 2,227 07/04/2020 0837   MONOABS 0.4 07/21/2018 1157   EOSABS 128 07/04/2020 0837   BASOSABS 43 07/04/2020 0837    Hgb A1C Lab Results  Component Value Date   HGBA1C 7 02/18/2023           Assessment & Plan:   COVID-19:  Encourage rest and fluids Discussed oral Paxlovid however given that she is on day 4 of symptoms and improving, will hold off at this time Recommend ibuprofen as needed for fever and bodyaches Recommend Zyrtec 10 mg 2 times  daily for the next 5 days for sore throat and runny nose Rx for Tessalon twice daily as needed for cough Rx for Promethazine DM cough syrup at bedtime Quarantine precautions discussed  RTC in 1 month, follow-up chronic conditions Nicki Reaper, NP

## 2023-05-07 NOTE — Patient Instructions (Signed)

## 2023-05-15 ENCOUNTER — Other Ambulatory Visit: Payer: Self-pay | Admitting: Internal Medicine

## 2023-05-15 DIAGNOSIS — E785 Hyperlipidemia, unspecified: Secondary | ICD-10-CM

## 2023-05-16 NOTE — Telephone Encounter (Signed)
Requested Prescriptions  Refused Prescriptions Disp Refills   atorvastatin (LIPITOR) 10 MG tablet [Pharmacy Med Name: Atorvastatin Calcium 10 MG Oral Tablet] 100 tablet 2    Sig: TAKE 1 TABLET BY MOUTH DAILY     Cardiovascular:  Antilipid - Statins Failed - 05/15/2023 10:37 PM      Failed - Lipid Panel in normal range within the last 12 months    Cholesterol  Date Value Ref Range Status  02/18/2023 148 <200 mg/dL Final   LDL Cholesterol (Calc)  Date Value Ref Range Status  02/18/2023 64 mg/dL (calc) Final    Comment:    Reference range: <100 . Desirable range <100 mg/dL for primary prevention;   <70 mg/dL for patients with CHD or diabetic patients  with > or = 2 CHD risk factors. Marland Kitchen LDL-C is now calculated using the Martin-Hopkins  calculation, which is a validated novel method providing  better accuracy than the Friedewald equation in the  estimation of LDL-C.  Horald Pollen et al. Lenox Ahr. 8295;621(30): 2061-2068  (http://education.[employer].com/faq/FAQ164)    HDL  Date Value Ref Range Status  02/18/2023 69 > OR = 50 mg/dL Final   Triglycerides  Date Value Ref Range Status  02/18/2023 70 <150 mg/dL Final         Passed - Patient is not pregnant      Passed - Valid encounter within last 12 months    Recent Outpatient Visits           1 week ago COVID-19   [place] [facility] [place], [place] W, NP   2 months ago Type 2 diabetes mellitus with hyperglycemia, without long-term current use of insulin [facility])   [place] [facility] [place], [place] W, NP   5 months ago Chronic left-sided low back pain with left-sided sciatica   [place] [facility] [place], Salvadore Oxford, NP   6 months ago Essential hypertension   [place] [facility] [place], [place] W, NP   9 months ago Type 2 diabetes mellitus with hyperglycemia, without long-term current use of insulin [facility])   [place] [facility] [place],  Salvadore Oxford, NP       Future Appointments             In 6 days [place], Salvadore Oxford, NP [place] [facility], [facility]

## 2023-05-22 ENCOUNTER — Encounter: Payer: Self-pay | Admitting: Internal Medicine

## 2023-05-22 ENCOUNTER — Ambulatory Visit: Payer: Medicare Other | Admitting: Internal Medicine

## 2023-05-22 VITALS — BP 128/76 | HR 75 | Temp 95.3°F | Wt 184.0 lb

## 2023-05-22 DIAGNOSIS — M85852 Other specified disorders of bone density and structure, left thigh: Secondary | ICD-10-CM | POA: Diagnosis not present

## 2023-05-22 DIAGNOSIS — E663 Overweight: Secondary | ICD-10-CM

## 2023-05-22 DIAGNOSIS — G8929 Other chronic pain: Secondary | ICD-10-CM

## 2023-05-22 DIAGNOSIS — I10 Essential (primary) hypertension: Secondary | ICD-10-CM | POA: Diagnosis not present

## 2023-05-22 DIAGNOSIS — M5442 Lumbago with sciatica, left side: Secondary | ICD-10-CM

## 2023-05-22 DIAGNOSIS — E1165 Type 2 diabetes mellitus with hyperglycemia: Secondary | ICD-10-CM

## 2023-05-22 DIAGNOSIS — E785 Hyperlipidemia, unspecified: Secondary | ICD-10-CM

## 2023-05-22 DIAGNOSIS — E1169 Type 2 diabetes mellitus with other specified complication: Secondary | ICD-10-CM | POA: Diagnosis not present

## 2023-05-22 DIAGNOSIS — R058 Other specified cough: Secondary | ICD-10-CM | POA: Diagnosis not present

## 2023-05-22 DIAGNOSIS — Z6829 Body mass index (BMI) 29.0-29.9, adult: Secondary | ICD-10-CM

## 2023-05-22 LAB — POCT GLYCOSYLATED HEMOGLOBIN (HGB A1C): Hemoglobin A1C: 6.8 % — AB (ref 4.0–5.6)

## 2023-05-22 MED ORDER — FLUTICASONE PROPIONATE HFA 44 MCG/ACT IN AERO: 1.0000 | INHALATION_SPRAY | Freq: Two times a day (BID) | RESPIRATORY_TRACT | 0 refills | Status: DC

## 2023-05-22 NOTE — Assessment & Plan Note (Signed)
Lipid profile reviewed Her to consume a low-fat diet Continue atorvastatin

## 2023-05-22 NOTE — Assessment & Plan Note (Signed)
Encouraged diet and exercise for weight loss ?

## 2023-05-22 NOTE — Patient Instructions (Signed)

## 2023-05-22 NOTE — Assessment & Plan Note (Signed)
POCT A1c 6.8% Urine microalbumin has been checked within the last year Encouraged her to consume a low-carb diet and exercise for weight loss Encouraged routine foot exam Encouraged routine eye exam

## 2023-05-22 NOTE — Assessment & Plan Note (Signed)
Continue calcium and vitamin D Encouraged daily weightbearing exercise

## 2023-05-22 NOTE — Assessment & Plan Note (Addendum)
Controlled on amlodipine and hydrochlorothiazide Reinforced DASH diet and exercise for weight loss Kidney function reviewed

## 2023-05-22 NOTE — Assessment & Plan Note (Signed)
Encouraged regular stretching and core strengthening Okay to take ibuprofen or naproxen OTC as needed

## 2023-05-22 NOTE — Progress Notes (Signed)
Subjective:    Patient ID: Linda Guzman, female    DOB: 1956/01/04, 67 y.o.   MRN: [id number]  HPI  Patient presents to clinic today for 64-month follow-up of chronic conditions.  HTN: Her BP today is 128/76.  She is taking amlodipine and HCTZ as prescribed.  ECG from 07/2018 reviewed.  HLD: Her last LDL was 64, triglycerides 70, 02/2023.  She denies myalgias on atorvastatin.  She tries to consume a low-fat diet.  DM2: Her last A1c was 7%, 02/2023.  She is not taking any oral diabetic medication at this time.  She does not check her sugars.  She checks her feet routinely.  Her last eye exam was 02/2023.  Flu 04/2022.  Prevnar 20 07/2021.  COVID never.  Osteopenia: She is taking calcium and vitamin D OTC.  She gets daily weightbearing exercise.  Bone density from 08/2018 reviewed.  Sciatica: Mainly on her left side.  She is taking meloxicam as prescribed.  X-ray lumbar spine from 01/2018 reviewed.  She does not follow with orthopedics.  She also reports persistent hoarseness and cough since having covid 9/18. The cough is non productive. She has tried Robitussin OTC with minimal relief of symptoms.  Review of Systems     Past Medical History:  Diagnosis Date   Hypertension     Current Outpatient Medications  Medication Sig Dispense Refill   amLODipine (NORVASC) 10 MG tablet TAKE 1 TABLET BY MOUTH EVERY DAY 90 tablet 1   atorvastatin (LIPITOR) 10 MG tablet Take 1 tablet (10 mg total) by mouth daily. 90 tablet 1   benzonatate (TESSALON) 200 MG capsule Take 1 capsule (200 mg total) by mouth 2 (two) times daily as needed. 20 capsule 0   cholecalciferol (VITAMIN D) 1000 units tablet Take 1,000 Units by mouth daily.     hydrochlorothiazide (HYDRODIURIL) 50 MG tablet TAKE 1 TABLET BY MOUTH EVERY DAY 90 tablet 0   Magnesium Oxide 250 MG TABS Take 250 mg by mouth daily.      meloxicam (MOBIC) 15 MG tablet TAKE 1 TABLET (15 MG TOTAL) BY MOUTH DAILY. 90 tablet 0   promethazine-dextromethorphan  (PROMETHAZINE-DM) 6.25-15 MG/5ML syrup Take 5 mLs by mouth 4 (four) times daily as needed. 118 mL 0   No current facility-administered medications for this visit.    Allergies  Allergen Reactions   Chloroquine Itching   Propofol     Patient had prolonged fatigue, weakness several weeks after propofol use for colonoscopy 2020. - Patient is not truly allergic, but this med should be used with caution in future    Family History  Problem Relation Age of Onset   Hypertension Mother    Stroke Sister    Breast cancer Neg Hx     Social History   Socioeconomic History   Marital status: Married    Spouse name: Not on file   Number of children: Not on file   Years of education: Not on file   Highest education level: Not on file  Occupational History   Not on file  Tobacco Use   Smoking status: Never   Smokeless tobacco: Never  Vaping Use   Vaping status: Never Used  Substance and Sexual Activity   Alcohol use: No   Drug use: No   Sexual activity: Yes    Birth control/protection: Post-menopausal  Other Topics Concern   Not on file  Social History Narrative   Not on file   Social Determinants of Corporate investment banker  Strain: Not on file  Food Insecurity: Not on file  Transportation Needs: Not on file  Physical Activity: Not on file  Stress: Not on file  Social Connections: Not on file  Intimate Partner Violence: Not on file     Constitutional: Denies fever, malaise, fatigue, headache or abrupt weight changes.  HEENT: Pt reports hoarseness. Denies eye pain, eye redness, ear pain, ringing in the ears, wax buildup, runny nose, nasal congestion, bloody nose, or sore throat. Respiratory: Pt report cough. Denies difficulty breathing, shortness of breath, or sputum production.   Cardiovascular: Denies chest pain, chest tightness, palpitations or swelling in the hands or feet.  Gastrointestinal: Denies abdominal pain, bloating, constipation, diarrhea or blood in the  stool.  GU: Denies urgency, frequency, pain with urination, burning sensation, blood in urine, odor or discharge. Musculoskeletal: Patient reports intermittent low back pain, left leg pain.  Denies decrease in range of motion, difficulty with gait, or joint swelling.  Skin: Denies redness, rashes, lesions or ulcercations.  Neurological: Denies dizziness, difficulty with memory, difficulty with speech or problems with balance and coordination.  Psych: Denies anxiety, depression, SI/HI.  No other specific complaints in a complete review of systems (except as listed in HPI above).  Objective:   Physical Exam  BP 128/76 (BP Location: Left Arm, Patient Position: Sitting, Cuff Size: Normal)   Pulse 75   Temp (!) 95.3 F (35.2 C) (Temporal)   Wt 184 lb (83.5 kg)   SpO2 99%   BMI 29.70 kg/m   Wt Readings from Last 3 Encounters:  05/07/23 181 lb (82.1 kg)  02/18/23 186 lb (84.4 kg)  12/10/22 179 lb 9.6 oz (81.5 kg)    General: Appears her stated age, overweight,  in NAD. Skin: Warm, dry and intact. No ulcerations noted. HEENT: Head: normal shape and size; Eyes: sclera white, no icterus, conjunctiva pink, PERRLA and EOMs intact;  Neck:  Neck supple, trachea midline. No masses, lumps or thyromegaly present.  Cardiovascular: Normal rate and rhythm. S1,S2 noted.  No murmur, rubs or gallops noted. No JVD or BLE edema. No carotid bruits noted. Pulmonary/Chest: Normal effort and positive vesicular breath sounds. No respiratory distress. No wheezes, rales or ronchi noted.  Musculoskeletal: No bony tenderness noted over the lumbar spine.  Strength 5/5 BLE.  No difficulty with gait.  Neurological: Alert and oriented. Coordination normal.    BMET    Component Value Date/Time   NA 143 02/18/2023 0837   K 3.2 (L) 02/18/2023 0837   CL 101 02/18/2023 0837   CO2 29 02/18/2023 0837   GLUCOSE 132 (H) 02/18/2023 0837   BUN 9 02/18/2023 0837   CREATININE 0.78 02/18/2023 0837   CALCIUM 9.9  02/18/2023 0837   GFRNONAA 60 07/04/2020 0837   GFRAA 70 07/04/2020 0837    Lipid Panel     Component Value Date/Time   CHOL 148 02/18/2023 0837   TRIG 70 02/18/2023 0837   HDL 69 02/18/2023 0837   CHOLHDL 2.1 02/18/2023 0837   VLDL 20 01/08/2017 0904   LDLCALC 64 02/18/2023 0837    CBC    Component Value Date/Time   WBC 6.3 02/18/2023 0837   RBC 5.19 (H) 02/18/2023 0837   HGB 14.1 02/18/2023 0837   HCT 44.6 02/18/2023 0837   PLT 258 02/18/2023 0837   MCV 85.9 02/18/2023 0837   MCH 27.2 02/18/2023 0837   MCHC 31.6 (L) 02/18/2023 0837   RDW 13.3 02/18/2023 0837   LYMPHSABS 2,227 07/04/2020 0837   MONOABS 0.4  07/21/2018 1157   EOSABS 128 07/04/2020 0837   BASOSABS 43 07/04/2020 0837    Hgb A1C Lab Results  Component Value Date   HGBA1C 7 02/18/2023            Assessment & Plan:   Postviral cough syndrome:  Reassured her that this cough could last 4 to 6 weeks after a viral infection Rx for Flovent 44 mcg twice daily x 1 month or until cough resolves Okay to continue Robitussin OTC as needed   RTC in 3 months for annual exam Nicki Reaper, NP

## 2023-05-25 ENCOUNTER — Other Ambulatory Visit: Payer: Self-pay | Admitting: Internal Medicine

## 2023-05-25 DIAGNOSIS — I10 Essential (primary) hypertension: Secondary | ICD-10-CM

## 2023-05-26 NOTE — Telephone Encounter (Signed)
Requested Prescriptions  Pending Prescriptions Disp Refills   hydrochlorothiazide (HYDRODIURIL) 50 MG tablet [Pharmacy Med Name: hydroCHLOROthiazide 50 MG Oral Tablet] 90 tablet 1    Sig: TAKE 1 TABLET BY MOUTH DAILY     Cardiovascular: Diuretics - Thiazide Failed - 05/25/2023  5:42 AM      Failed - K in normal range and within 180 days    Potassium  Date Value Ref Range Status  02/18/2023 3.2 (L) 3.5 - 5.3 mmol/L Final         Passed - Cr in normal range and within 180 days    Creat  Date Value Ref Range Status  02/18/2023 0.78 0.50 - 1.05 mg/dL Final   Creatinine, Urine  Date Value Ref Range Status  02/18/2023 24 20 - 275 mg/dL Final         Passed - Na in normal range and within 180 days    Sodium  Date Value Ref Range Status  02/18/2023 143 135 - 146 mmol/L Final         Passed - Last BP in normal range    BP Readings from Last 1 Encounters:  05/22/23 128/76         Passed - Valid encounter within last 6 months    Recent Outpatient Visits           4 days ago Type 2 diabetes mellitus with hyperglycemia, without long-term current use of insulin [facility])   [place] [facility] [place], Salvadore Oxford, NP   2 weeks ago COVID-19   [facility] [place], [place] W, NP   3 months ago Type 2 diabetes mellitus with hyperglycemia, without long-term current use of insulin [facility])   [place] [facility] [place], [place] W, NP   5 months ago Chronic left-sided low back pain with left-sided sciatica   [place] [facility] [place], Salvadore Oxford, NP   6 months ago Essential hypertension   [facility] [place], Salvadore Oxford, NP       Future Appointments             In 2 months Baity, Salvadore Oxford, NP [place] [facility], [facility]

## 2023-05-28 ENCOUNTER — Other Ambulatory Visit: Payer: Self-pay | Admitting: Internal Medicine

## 2023-05-28 NOTE — Telephone Encounter (Signed)
Request too soon. Last filled 04/23/23 #90, 0RF Requested Prescriptions  Pending Prescriptions Disp Refills   meloxicam (MOBIC) 15 MG tablet [Pharmacy Med Name: Meloxicam 15 MG Oral Tablet] 90 tablet 3    Sig: TAKE 1 TABLET BY MOUTH DAILY     Analgesics:  COX2 Inhibitors Failed - 05/28/2023  5:34 AM      Failed - Manual Review: Labs are only required if the patient has taken medication for more than 8 weeks.      Failed - ALT in normal range and within 360 days    ALT  Date Value Ref Range Status  02/18/2023 30 (H) 6 - 29 U/L Final         Passed - HGB in normal range and within 360 days    Hemoglobin  Date Value Ref Range Status  02/18/2023 14.1 11.7 - 15.5 g/dL Final         Passed - Cr in normal range and within 360 days    Creat  Date Value Ref Range Status  02/18/2023 0.78 0.50 - 1.05 mg/dL Final   Creatinine, Urine  Date Value Ref Range Status  02/18/2023 24 20 - 275 mg/dL Final         Passed - HCT in normal range and within 360 days    HCT  Date Value Ref Range Status  02/18/2023 44.6 35.0 - 45.0 % Final         Passed - AST in normal range and within 360 days    AST  Date Value Ref Range Status  02/18/2023 25 10 - 35 U/L Final         Passed - eGFR is 30 or above and within 360 days    GFR, Est African American  Date Value Ref Range Status  07/04/2020 70 > OR = 60 mL/min/1.55m2 Final   GFR, Est Non African American  Date Value Ref Range Status  07/04/2020 60 > OR = 60 mL/min/1.91m2 Final   eGFR  Date Value Ref Range Status  02/18/2023 84 > OR = 60 mL/min/1.83m2 Final         Passed - Patient is not pregnant      Passed - Valid encounter within last 12 months    Recent Outpatient Visits           6 days ago Type 2 diabetes mellitus with hyperglycemia, without long-term current use of insulin [facility])   [place] [facility] [place], Salvadore Oxford, NP   3 weeks ago COVID-19   [facility] [place], [place], NP    3 months ago Type 2 diabetes mellitus with hyperglycemia, without long-term current use of insulin [facility])   [place] [facility] [place], [place] W, NP   5 months ago Chronic left-sided low back pain with left-sided sciatica   [place] [facility] [place], Salvadore Oxford, NP   6 months ago Essential hypertension   [place] [facility] [place], Salvadore Oxford, NP       Future Appointments             In 2 months Baity, Salvadore Oxford, NP [place] [facility], [facility]

## 2023-06-25 ENCOUNTER — Other Ambulatory Visit: Payer: Self-pay | Admitting: Internal Medicine

## 2023-06-25 DIAGNOSIS — I10 Essential (primary) hypertension: Secondary | ICD-10-CM

## 2023-06-26 NOTE — Telephone Encounter (Signed)
Requested Prescriptions  Refused Prescriptions Disp Refills   amLODipine (NORVASC) 10 MG tablet [Pharmacy Med Name: amLODIPine Besylate 10 MG Oral Tablet] 100 tablet 2    Sig: TAKE 1 TABLET BY MOUTH DAILY     Cardiovascular: Calcium Channel Blockers 2 Passed - 06/25/2023 10:02 PM      Passed - Last BP in normal range    BP Readings from Last 1 Encounters:  05/22/23 128/76         Passed - Last Heart Rate in normal range    Pulse Readings from Last 1 Encounters:  05/22/23 75         Passed - Valid encounter within last 6 months    Recent Outpatient Visits           1 month ago Type 2 diabetes mellitus with hyperglycemia, without long-term current use of insulin [facility])   [place] [facility] [place], Salvadore Oxford, NP   1 month ago COVID-19   [facility] [place], [place] W, NP   4 months ago Type 2 diabetes mellitus with hyperglycemia, without long-term current use of insulin [facility])   [facility] [place], [place] W, NP   6 months ago Chronic left-sided low back pain with left-sided sciatica   [place] [facility] [place], Salvadore Oxford, NP   7 months ago Essential hypertension   [place] [facility] [place], Salvadore Oxford, NP       Future Appointments             In 1 month [place], Salvadore Oxford, NP  [place], [facility]

## 2023-08-01 ENCOUNTER — Ambulatory Visit: Payer: Medicare Other | Admitting: Internal Medicine

## 2023-08-11 ENCOUNTER — Other Ambulatory Visit: Payer: Self-pay | Admitting: Internal Medicine

## 2023-08-11 DIAGNOSIS — E785 Hyperlipidemia, unspecified: Secondary | ICD-10-CM

## 2023-08-12 NOTE — Telephone Encounter (Signed)
Requested Prescriptions  Pending Prescriptions Disp Refills   atorvastatin (LIPITOR) 10 MG tablet [Pharmacy Med Name: Atorvastatin Calcium 10 MG Oral Tablet] 90 tablet 1    Sig: TAKE 1 TABLET BY MOUTH DAILY     Cardiovascular:  Antilipid - Statins Failed - 08/12/2023  3:01 PM      Failed - Lipid Panel in normal range within the last 12 months    Cholesterol  Date Value Ref Range Status  02/18/2023 148 <200 mg/dL Final   LDL Cholesterol (Calc)  Date Value Ref Range Status  02/18/2023 64 mg/dL (calc) Final    Comment:    Reference range: <100 . Desirable range <100 mg/dL for primary prevention;   <70 mg/dL for patients with CHD or diabetic patients  with > or = 2 CHD risk factors. Marland Kitchen LDL-C is now calculated using the Martin-Hopkins  calculation, which is a validated novel method providing  better accuracy than the Friedewald equation in the  estimation of LDL-C.  Horald Pollen et al. Lenox Ahr. 1610;960(45): 2061-2068  (http://education.[employer].com/faq/FAQ164)    HDL  Date Value Ref Range Status  02/18/2023 69 > OR = 50 mg/dL Final   Triglycerides  Date Value Ref Range Status  02/18/2023 70 <150 mg/dL Final         Passed - Patient is not pregnant      Passed - Valid encounter within last 12 months    Recent Outpatient Visits           2 months ago Type 2 diabetes mellitus with hyperglycemia, without long-term current use of insulin [facility])   [facility] [place], Salvadore Oxford, NP   3 months ago COVID-19   [facility] [place], [place] W, NP   5 months ago Type 2 diabetes mellitus with hyperglycemia, without long-term current use of insulin [facility])   [facility] [place], [place] W, NP   8 months ago Chronic left-sided low back pain with left-sided sciatica   [place] [facility] [place], Salvadore Oxford, NP   9 months ago Essential hypertension   [facility]  [place], Salvadore Oxford, NP       Future Appointments             In 1 week Sampson Si, Salvadore Oxford, NP [facility], [facility]

## 2023-08-19 ENCOUNTER — Ambulatory Visit (INDEPENDENT_AMBULATORY_CARE_PROVIDER_SITE_OTHER): Payer: Medicare Other | Admitting: Internal Medicine

## 2023-08-19 ENCOUNTER — Encounter: Payer: Self-pay | Admitting: Internal Medicine

## 2023-08-19 VITALS — BP 118/82 | Ht 66.0 in | Wt 185.4 lb

## 2023-08-19 DIAGNOSIS — M79605 Pain in left leg: Secondary | ICD-10-CM

## 2023-08-19 DIAGNOSIS — Z23 Encounter for immunization: Secondary | ICD-10-CM | POA: Diagnosis not present

## 2023-08-19 DIAGNOSIS — Z0001 Encounter for general adult medical examination with abnormal findings: Secondary | ICD-10-CM | POA: Diagnosis not present

## 2023-08-19 DIAGNOSIS — G8929 Other chronic pain: Secondary | ICD-10-CM

## 2023-08-19 DIAGNOSIS — M25572 Pain in left ankle and joints of left foot: Secondary | ICD-10-CM | POA: Diagnosis not present

## 2023-08-19 DIAGNOSIS — Z6829 Body mass index (BMI) 29.0-29.9, adult: Secondary | ICD-10-CM

## 2023-08-19 DIAGNOSIS — E1165 Type 2 diabetes mellitus with hyperglycemia: Secondary | ICD-10-CM | POA: Diagnosis not present

## 2023-08-19 DIAGNOSIS — E663 Overweight: Secondary | ICD-10-CM

## 2023-08-19 NOTE — Progress Notes (Signed)
 Subjective:    Patient ID: Linda Guzman, female    DOB: [birth year], 67 y.o.   MRN: [id number]  HPI  Patient presents to clinic today for her annual exam.  Flu: 04/2022 Tetanus: 07/2021 COVID: x 3 Pneumovax: Never Prevnar 20: 07/2021 Shingrix: Never Pap smear: 03/2018 Mammogram: 08/2018 Bone density: 08/2018 Colon screening: 08/2022 Vision screening: annually Dentist: as needed  Diet: She does eat meat. She consumes fruits and veggies. She does eat fried foods. She drinks mostly water. Exercise: Walking  Review of Systems     Past Medical History:  Diagnosis Date   Hypertension     Current Outpatient Medications  Medication Sig Dispense Refill   amLODipine  (NORVASC ) 10 MG tablet TAKE 1 TABLET BY MOUTH EVERY DAY 90 tablet 1   atorvastatin  (LIPITOR) 10 MG tablet TAKE 1 TABLET BY MOUTH DAILY 90 tablet 1   benzonatate  (TESSALON ) 200 MG capsule Take 1 capsule (200 mg total) by mouth 2 (two) times daily as needed. 20 capsule 0   cholecalciferol (VITAMIN D) 1000 units tablet Take 1,000 Units by mouth daily.     fluticasone  (FLOVENT  HFA) 44 MCG/ACT inhaler Inhale 1 puff into the lungs 2 (two) times daily. 1 each 0   hydrochlorothiazide  (HYDRODIURIL ) 50 MG tablet TAKE 1 TABLET BY MOUTH DAILY 90 tablet 1   Magnesium Oxide 250 MG TABS Take 250 mg by mouth daily.      meloxicam  (MOBIC ) 15 MG tablet TAKE 1 TABLET (15 MG TOTAL) BY MOUTH DAILY. 90 tablet 0   promethazine -dextromethorphan (PROMETHAZINE -DM) 6.25-15 MG/5ML syrup Take 5 mLs by mouth 4 (four) times daily as needed. 118 mL 0   No current facility-administered medications for this visit.    Allergies  Allergen Reactions   Chloroquine Itching   Propofol      Patient had prolonged fatigue, weakness several weeks after propofol  use for colonoscopy 2020. - Patient is not truly allergic, but this med should be used with caution in future    Family History  Problem Relation Age of Onset   Hypertension Mother    Stroke Sister     Breast cancer Neg Hx     Social History   Socioeconomic History   Marital status: Married    Spouse name: Not on file   Number of children: Not on file   Years of education: Not on file   Highest education level: Not on file  Occupational History   Not on file  Tobacco Use   Smoking status: Never   Smokeless tobacco: Never  Vaping Use   Vaping status: Never Used  Substance and Sexual Activity   Alcohol use: No   Drug use: No   Sexual activity: Yes    Birth control/protection: Post-menopausal  Other Topics Concern   Not on file  Social History Narrative   Not on file   Social Drivers of Health   Financial Resource Strain: Not on file  Food Insecurity: Not on file  Transportation Needs: Not on file  Physical Activity: Not on file  Stress: Not on file  Social Connections: Not on file  Intimate Partner Violence: Not on file     Constitutional: Denies fever, malaise, fatigue, headache or abrupt weight changes.  HEENT: Denies eye pain, eye redness, ear pain, ringing in the ears, wax buildup, runny nose, nasal congestion, bloody nose, or sore throat. Respiratory: Denies difficulty breathing, shortness of breath, cough or sputum production.   Cardiovascular: Denies chest pain, chest tightness, palpitations or swelling in the hands  or feet.  Gastrointestinal: Denies abdominal pain, bloating, constipation, diarrhea or blood in the stool.  GU: Denies urgency, frequency, pain with urination, burning sensation, blood in urine, odor or discharge. Musculoskeletal: Patient reports back pain, left lower leg and ankle pain.  Denies decrease in range of motion, difficulty with gait, or joint swelling.  Skin: Denies redness, rashes, lesions or ulcercations.  Neurological:  Denies dizziness, difficulty with memory, difficulty with speech or problems with balance and coordination.  Psych: Denies anxiety, depression, SI/HI.  No other specific complaints in a complete review of systems  (except as listed in HPI above).  Objective:   Physical Exam  BP 118/82 (BP Location: Left Arm, Patient Position: Sitting, Cuff Size: Large)   Ht 5' 6 (1.676 m)   Wt 185 lb 6.4 oz (84.1 kg)   BMI 29.92 kg/m    Wt Readings from Last 3 Encounters:  05/22/23 184 lb (83.5 kg)  05/07/23 181 lb (82.1 kg)  02/18/23 186 lb (84.4 kg)    General: Appears her stated age, overweight, in NAD. Skin: Warm, dry and intact.  HEENT: Head: normal shape and size; Eyes: sclera white, no icterus, conjunctiva pink, PERRLA and EOMs intact;  Neck:  Neck supple, trachea midline. No masses, lumps or thyromegaly present.  Cardiovascular: Normal rate and rhythm. S1,S2 noted.  No murmur, rubs or gallops noted. No JVD or BLE edema. No carotid bruits noted. Pulmonary/Chest: Normal effort and positive vesicular breath sounds. No respiratory distress. No wheezes, rales or ronchi noted.  Abdomen: Soft and nontender. Normal bowel sounds.  Musculoskeletal: Pain with palpation behind the left calf and inferior to the left lateral malleolus.  Strength 5/5 BUE.  Strength 5/5 RLE, 4/5 LLE.  No difficulty with gait.  Neurological: Alert and oriented. Cranial nerves II-XII grossly intact. Coordination normal.  Psychiatric: Mood and affect normal. Behavior is normal. Judgment and thought content normal.     BMET    Component Value Date/Time   NA 143 02/18/2023 0837   K 3.2 (L) 02/18/2023 0837   CL 101 02/18/2023 0837   CO2 29 02/18/2023 0837   GLUCOSE 132 (H) 02/18/2023 0837   BUN 9 02/18/2023 0837   CREATININE 0.78 02/18/2023 0837   CALCIUM  9.9 02/18/2023 0837   GFRNONAA 60 07/04/2020 0837   GFRAA 70 07/04/2020 0837    Lipid Panel     Component Value Date/Time   CHOL 148 02/18/2023 0837   TRIG 70 02/18/2023 0837   HDL 69 02/18/2023 0837   CHOLHDL 2.1 02/18/2023 0837   VLDL 20 01/08/2017 0904   LDLCALC 64 02/18/2023 0837    CBC    Component Value Date/Time   WBC 6.3 02/18/2023 0837   RBC 5.19 (H)  02/18/2023 0837   HGB 14.1 02/18/2023 0837   HCT 44.6 02/18/2023 0837   PLT 258 02/18/2023 0837   MCV 85.9 02/18/2023 0837   MCH 27.2 02/18/2023 0837   MCHC 31.6 (L) 02/18/2023 0837   RDW 13.3 02/18/2023 0837   LYMPHSABS 2,227 07/04/2020 0837   MONOABS 0.4 07/21/2018 1157   EOSABS 128 07/04/2020 0837   BASOSABS 43 07/04/2020 0837    Hgb A1C Lab Results  Component Value Date   HGBA1C 6.8 (A) 05/22/2023          Assessment & Plan:   Preventative Health Maintenance:  Flu shot  today Tetanus UTD Encouraged her to get her COVID-vaccine Prevnar UTD, will not need Pneumovax Discussed Shingrix vaccine, she will check coverage with her insurance company  and schedule a nurse visit if she would like to have this done She no longer needs to screen for cervical cancer She is [facility] mammogram and bone density at this time Colon screening UTD Encouraged her to consume a balanced diet and exercise regimen Advised her to see an eye doctor and dentist annually We will check CBC, c-Met, lipid, A1c today  Left leg pain, left ankle pain:  Continue meloxicam  Referral to orthopedics for further evaluation and treatment RTC in 6 months, follow-up chronic conditions Angeline Laura, NP

## 2023-08-19 NOTE — Patient Instructions (Signed)
 Health Maintenance for Postmenopausal Women Menopause is a normal process in which your ability to get pregnant comes to an end. This process happens slowly over many months or years, usually between the ages of 76 and 38. Menopause is complete when you have missed your menstrual period for 12 months. It is important to talk with your health care provider about some of the most common conditions that affect women after menopause (postmenopausal women). These include heart disease, cancer, and bone loss (osteoporosis). Adopting a healthy lifestyle and getting preventive care can help to promote your health and wellness. The actions you take can also lower your chances of developing some of these common conditions. What are the signs and symptoms of menopause? During menopause, you may have the following symptoms: Hot flashes. These can be moderate or severe. Night sweats. Decrease in sex drive. Mood swings. Headaches. Tiredness (fatigue). Irritability. Memory problems. Problems falling asleep or staying asleep. Talk with your health care provider about treatment options for your symptoms. Do I need hormone replacement therapy? Hormone replacement therapy is effective in treating symptoms that are caused by menopause, such as hot flashes and night sweats. Hormone replacement carries certain risks, especially as you become older. If you are thinking about using estrogen or estrogen with progestin, discuss the benefits and risks with your health care provider. How can I reduce my risk for heart disease and stroke? The risk of heart disease, heart attack, and stroke increases as you age. One of the causes may be a change in the body's hormones during menopause. This can affect how your body uses dietary fats, triglycerides, and cholesterol. Heart attack and stroke are medical emergencies. There are many things that you can do to help prevent heart disease and stroke. Watch your blood pressure High  blood pressure causes heart disease and increases the risk of stroke. This is more likely to develop in people who have high blood pressure readings or are overweight. Have your blood pressure checked: Every 3-5 years if you are 32-23 years of age. Every year if you are 31 years old or older. Eat a healthy diet  Eat a diet that includes plenty of vegetables, fruits, low-fat dairy products, and lean protein. Do not eat a lot of foods that are high in solid fats, added sugars, or sodium. Get regular exercise Get regular exercise. This is one of the most important things you can do for your health. Most adults should: Try to exercise for at least 150 minutes each week. The exercise should increase your heart rate and make you sweat (moderate-intensity exercise). Try to do strengthening exercises at least twice each week. Do these in addition to the moderate-intensity exercise. Spend less time sitting. Even light physical activity can be beneficial. Other tips Work with your health care provider to achieve or maintain a healthy weight. Do not use any products that contain nicotine or tobacco. These products include cigarettes, chewing tobacco, and vaping devices, such as e-cigarettes. If you need help quitting, ask your health care provider. Know your numbers. Ask your health care provider to check your cholesterol and your blood sugar (glucose). Continue to have your blood tested as directed by your health care provider. Do I need screening for cancer? Depending on your health history and family history, you may need to have cancer screenings at different stages of your life. This may include screening for: Breast cancer. Cervical cancer. Lung cancer. Colorectal cancer. What is my risk for osteoporosis? After menopause, you may be  at increased risk for osteoporosis. Osteoporosis is a condition in which bone destruction happens more quickly than new bone creation. To help prevent osteoporosis or  the bone fractures that can happen because of osteoporosis, you may take the following actions: If you are 24-54 years old, get at least 1,000 mg of calcium and at least 600 international units (IU) of vitamin D  per day. If you are older than age 75 but younger than age 30, get at least 1,200 mg of calcium and at least 600 international units (IU) of vitamin D  per day. If you are older than age 8, get at least 1,200 mg of calcium and at least 800 international units (IU) of vitamin D  per day. Smoking and drinking excessive alcohol increase the risk of osteoporosis. Eat foods that are rich in calcium and vitamin D , and do weight-bearing exercises several times each week as directed by your health care provider. How does menopause affect my mental health? Depression may occur at any age, but it is more common as you become older. Common symptoms of depression include: Feeling depressed. Changes in sleep patterns. Changes in appetite or eating patterns. Feeling an overall lack of motivation or enjoyment of activities that you previously enjoyed. Frequent crying spells. Talk with your health care provider if you think that you are experiencing any of these symptoms. General instructions See your health care provider for regular wellness exams and vaccines. This may include: Scheduling regular health, dental, and eye exams. Getting and maintaining your vaccines. These include: Influenza vaccine. Get this vaccine each year before the flu season begins. Pneumonia vaccine. Shingles vaccine. Tetanus, diphtheria, and pertussis (Tdap) booster vaccine. Your health care provider may also recommend other immunizations. Tell your health care provider if you have ever been abused or do not feel safe at home. Summary Menopause is a normal process in which your ability to get pregnant comes to an end. This condition causes hot flashes, night sweats, decreased interest in sex, mood swings, headaches, or lack  of sleep. Treatment for this condition may include hormone replacement therapy. Take actions to keep yourself healthy, including exercising regularly, eating a healthy diet, watching your weight, and checking your blood pressure and blood sugar levels. Get screened for cancer and depression. Make sure that you are up to date with all your vaccines. This information is not intended to replace advice given to you by your health care provider. Make sure you discuss any questions you have with your health care provider. Document Revised: 12/25/2020 Document Reviewed: 12/25/2020 Elsevier Patient Education  2024 [employer].

## 2023-08-19 NOTE — Assessment & Plan Note (Signed)
 Encouraged diet and exercise for weight loss ?

## 2023-08-25 ENCOUNTER — Other Ambulatory Visit: Payer: Medicare Other

## 2023-08-25 DIAGNOSIS — Z0001 Encounter for general adult medical examination with abnormal findings: Secondary | ICD-10-CM

## 2023-08-25 DIAGNOSIS — E1165 Type 2 diabetes mellitus with hyperglycemia: Secondary | ICD-10-CM

## 2023-09-02 DIAGNOSIS — H401131 Primary open-angle glaucoma, bilateral, mild stage: Secondary | ICD-10-CM | POA: Diagnosis not present

## 2023-09-02 DIAGNOSIS — H2513 Age-related nuclear cataract, bilateral: Secondary | ICD-10-CM | POA: Diagnosis not present

## 2023-09-02 DIAGNOSIS — H35371 Puckering of macula, right eye: Secondary | ICD-10-CM | POA: Diagnosis not present

## 2023-09-02 DIAGNOSIS — H40013 Open angle with borderline findings, low risk, bilateral: Secondary | ICD-10-CM | POA: Diagnosis not present

## 2023-09-09 ENCOUNTER — Other Ambulatory Visit: Payer: Self-pay | Admitting: Internal Medicine

## 2023-09-09 DIAGNOSIS — I10 Essential (primary) hypertension: Secondary | ICD-10-CM

## 2023-09-10 NOTE — Telephone Encounter (Signed)
Requested Prescriptions  Pending Prescriptions Disp Refills   amLODipine (NORVASC) 10 MG tablet [Pharmacy Med Name: amLODIPine Besylate 10 MG Oral Tablet] 90 tablet 1    Sig: TAKE 1 TABLET BY MOUTH DAILY     Cardiovascular: Calcium Channel Blockers 2 Passed - 09/10/2023  9:08 AM      Passed - Last BP in normal range    BP Readings from Last 1 Encounters:  08/19/23 118/82         Passed - Last Heart Rate in normal range    Pulse Readings from Last 1 Encounters:  05/22/23 75         Passed - Valid encounter within last 6 months    Recent Outpatient Visits           3 weeks ago Encounter for general adult medical examination with abnormal findings   [facility] [place], [place] W, NP   3 months ago Type 2 diabetes mellitus with hyperglycemia, without long-term current use of insulin [facility])   [place] [place], Salvadore Oxford, NP   4 months ago COVID-19   [facility] [place], [place] W, NP   6 months ago Type 2 diabetes mellitus with hyperglycemia, without long-term current use of insulin [facility])   [facility] [place], [place] W, NP   9 months ago Chronic left-sided low back pain with left-sided sciatica   [facility] [place], Salvadore Oxford, NP       Future Appointments             In 5 months Baity, Salvadore Oxford, NP  [facility]

## 2023-09-16 ENCOUNTER — Other Ambulatory Visit: Payer: Self-pay | Admitting: Internal Medicine

## 2023-09-16 DIAGNOSIS — M5416 Radiculopathy, lumbar region: Secondary | ICD-10-CM | POA: Diagnosis not present

## 2023-09-16 DIAGNOSIS — I10 Essential (primary) hypertension: Secondary | ICD-10-CM

## 2023-09-16 NOTE — Telephone Encounter (Signed)
Sending to [facility], lasted refilled at mail order pharmacy.  Requested Prescriptions  Pending Prescriptions Disp Refills   hydrochlorothiazide (HYDRODIURIL) 50 MG tablet [Pharmacy Med Name: HYDROCHLOROTHIAZIDE 50 MG TAB] 90 tablet 0    Sig: TAKE 1 TABLET BY MOUTH EVERY DAY     Cardiovascular: Diuretics - Thiazide Failed - 09/16/2023  8:23 AM      Failed - Cr in normal range and within 180 days    Creat  Date Value Ref Range Status  02/18/2023 0.78 0.50 - 1.05 mg/dL Final   Creatinine, Urine  Date Value Ref Range Status  02/18/2023 24 20 - 275 mg/dL Final         Failed - K in normal range and within 180 days    Potassium  Date Value Ref Range Status  02/18/2023 3.2 (L) 3.5 - 5.3 mmol/L Final         Failed - Na in normal range and within 180 days    Sodium  Date Value Ref Range Status  02/18/2023 143 135 - 146 mmol/L Final         Passed - Last BP in normal range    BP Readings from Last 1 Encounters:  08/19/23 118/82         Passed - Valid encounter within last 6 months    Recent Outpatient Visits           4 weeks ago Encounter for general adult medical examination with abnormal findings   [place] [facility] [place], Salvadore Oxford, NP   3 months ago Type 2 diabetes mellitus with hyperglycemia, without long-term current use of insulin [facility])   [facility] [place], Salvadore Oxford, NP   4 months ago COVID-19   [facility] [place], [place] W, NP   7 months ago Type 2 diabetes mellitus with hyperglycemia, without long-term current use of insulin [facility])   [place] [facility] [place], [place] W, NP   9 months ago Chronic left-sided low back pain with left-sided sciatica   [place] [facility] [place], Salvadore Oxford, NP       Future Appointments             In 5 months Baity, Salvadore Oxford, NP [facility], [facility]

## 2023-09-17 ENCOUNTER — Other Ambulatory Visit: Payer: Self-pay | Admitting: Internal Medicine

## 2023-09-17 NOTE — Telephone Encounter (Signed)
Requested medication (s) are due for refill today: routing for review  Requested medication (s) are on the active medication list: yes  Last refill:  04/23/23  Future visit scheduled: yes  Notes to clinic:  Patient not taking: Reported on 08/19/2023, routing for review.     Requested Prescriptions  Pending Prescriptions Disp Refills   meloxicam (MOBIC) 15 MG tablet [Pharmacy Med Name: MELOXICAM 15 MG TABLET] 90 tablet 0    Sig: TAKE 1 TABLET (15 MG TOTAL) BY MOUTH DAILY.     Analgesics:  COX2 Inhibitors Failed - 09/17/2023  8:32 AM      Failed - Manual Review: Labs are only required if the patient has taken medication for more than 8 weeks.      Failed - ALT in normal range and within 360 days    ALT  Date Value Ref Range Status  02/18/2023 30 (H) 6 - 29 U/L Final         Passed - HGB in normal range and within 360 days    Hemoglobin  Date Value Ref Range Status  02/18/2023 14.1 11.7 - 15.5 g/dL Final         Passed - Cr in normal range and within 360 days    Creat  Date Value Ref Range Status  02/18/2023 0.78 0.50 - 1.05 mg/dL Final   Creatinine, Urine  Date Value Ref Range Status  02/18/2023 24 20 - 275 mg/dL Final         Passed - HCT in normal range and within 360 days    HCT  Date Value Ref Range Status  02/18/2023 44.6 35.0 - 45.0 % Final         Passed - AST in normal range and within 360 days    AST  Date Value Ref Range Status  02/18/2023 25 10 - 35 U/L Final         Passed - eGFR is 30 or above and within 360 days    GFR, Est African American  Date Value Ref Range Status  07/04/2020 70 > OR = 60 mL/min/1.56m2 Final   GFR, Est Non African American  Date Value Ref Range Status  07/04/2020 60 > OR = 60 mL/min/1.56m2 Final   eGFR  Date Value Ref Range Status  02/18/2023 84 > OR = 60 mL/min/1.72m2 Final         Passed - Patient is not pregnant      Passed - Valid encounter within last 12 months    Recent Outpatient Visits           4 weeks  ago Encounter for general adult medical examination with abnormal findings   [facility] [place], [place] W, NP   3 months ago Type 2 diabetes mellitus with hyperglycemia, without long-term current use of insulin [facility])   [facility] [place], Salvadore Oxford, NP   4 months ago COVID-19   [facility] [place], [place], NP   7 months ago Type 2 diabetes mellitus with hyperglycemia, without long-term current use of insulin [facility])   [facility] [place], [place] W, NP   9 months ago Chronic left-sided low back pain with left-sided sciatica    [facility] [place], Salvadore Oxford, NP       Future Appointments             In 5 months Sampson Si, Salvadore Oxford, NP  [facility], [place]

## 2023-09-25 DIAGNOSIS — H35371 Puckering of macula, right eye: Secondary | ICD-10-CM | POA: Diagnosis not present

## 2023-09-25 DIAGNOSIS — H401131 Primary open-angle glaucoma, bilateral, mild stage: Secondary | ICD-10-CM | POA: Diagnosis not present

## 2023-09-25 DIAGNOSIS — H2513 Age-related nuclear cataract, bilateral: Secondary | ICD-10-CM | POA: Diagnosis not present

## 2023-10-10 ENCOUNTER — Ambulatory Visit: Payer: Self-pay

## 2023-10-25 ENCOUNTER — Other Ambulatory Visit: Payer: Self-pay | Admitting: Internal Medicine

## 2023-10-25 DIAGNOSIS — E785 Hyperlipidemia, unspecified: Secondary | ICD-10-CM

## 2023-10-27 NOTE — Telephone Encounter (Signed)
 Requested Prescriptions  Pending Prescriptions Disp Refills   atorvastatin (LIPITOR) 10 MG tablet [Pharmacy Med Name: Atorvastatin Calcium 10 MG Oral Tablet] 100 tablet 0    Sig: TAKE 1 TABLET BY MOUTH DAILY     Cardiovascular:  Antilipid - Statins Failed - 10/27/2023  4:26 PM      Failed - Lipid Panel in normal range within the last 12 months    Cholesterol  Date Value Ref Range Status  02/18/2023 148 <200 mg/dL Final   LDL Cholesterol (Calc)  Date Value Ref Range Status  02/18/2023 64 mg/dL (calc) Final    Comment:    Reference range: <100 . Desirable range <100 mg/dL for primary prevention;   <70 mg/dL for patients with CHD or diabetic patients  with > or = 2 CHD risk factors. Marland Kitchen LDL-C is now calculated using the Martin-Hopkins  calculation, which is a validated novel method providing  better accuracy than the Friedewald equation in the  estimation of LDL-C.  Horald Pollen et al. Lenox Ahr. 1610;960(45): 2061-2068  (http://education.[facility].com/faq/FAQ164)    HDL  Date Value Ref Range Status  02/18/2023 69 > OR = 50 mg/dL Final   Triglycerides  Date Value Ref Range Status  02/18/2023 70 <150 mg/dL Final         Passed - Patient is not pregnant      Passed - Valid encounter within last 12 months    Recent Outpatient Visits           2 months ago Encounter for general adult medical examination with abnormal findings   [place] [facility] [place], [place] W, NP   5 months ago Type 2 diabetes mellitus with hyperglycemia, without long-term current use of insulin [facility])   [facility] [place], Salvadore Oxford, NP   5 months ago COVID-19   [facility] [place], [place] W, NP   8 months ago Type 2 diabetes mellitus with hyperglycemia, without long-term current use of insulin [facility])   [place] [facility] [place], [place] W, NP   10 months ago Chronic left-sided low back pain with left-sided  sciatica   [place] [facility] [place], Salvadore Oxford, NP       Future Appointments             In 3 months Baity, Salvadore Oxford, NP [place] [facility]

## 2023-11-23 ENCOUNTER — Other Ambulatory Visit: Payer: Self-pay | Admitting: Internal Medicine

## 2023-11-23 DIAGNOSIS — I10 Essential (primary) hypertension: Secondary | ICD-10-CM

## 2023-11-25 NOTE — Telephone Encounter (Signed)
 Refill too early. Refilled 09/10/23 # 90 with 1 refill. Requested Prescriptions  Refused Prescriptions Disp Refills   amLODipine (NORVASC) 10 MG tablet [Pharmacy Med Name: amLODIPine Besylate 10 MG Oral Tablet] 100 tablet 2    Sig: TAKE 1 TABLET BY MOUTH DAILY     Cardiovascular: Calcium Channel Blockers 2 Failed - 11/25/2023 10:24 AM      Failed - Valid encounter within last 6 months    Recent Outpatient Visits   None     Future Appointments             In 2 months Linda Guzman, Linda Oxford, NP [place] [facility]            Passed - Last BP in normal range    BP Readings from Last 1 Encounters:  08/19/23 118/82         Passed - Last Heart Rate in normal range    Pulse Readings from Last 1 Encounters:  05/22/23 75

## 2023-12-11 ENCOUNTER — Other Ambulatory Visit: Payer: Self-pay | Admitting: Internal Medicine

## 2023-12-11 DIAGNOSIS — I10 Essential (primary) hypertension: Secondary | ICD-10-CM

## 2023-12-11 NOTE — Telephone Encounter (Signed)
 Requested Prescriptions  Pending Prescriptions Disp Refills   hydrochlorothiazide  (HYDRODIURIL ) 50 MG tablet [Pharmacy Med Name: HYDROCHLOROTHIAZIDE  50 MG TAB] 90 tablet 0    Sig: TAKE 1 TABLET BY MOUTH EVERY DAY     Cardiovascular: Diuretics - Thiazide Failed - 12/11/2023  5:38 PM      Failed - Cr in normal range and within 180 days    Creat  Date Value Ref Range Status  02/18/2023 0.78 0.50 - 1.05 mg/dL Final   Creatinine, Urine  Date Value Ref Range Status  02/18/2023 24 20 - 275 mg/dL Final         Failed - K in normal range and within 180 days    Potassium  Date Value Ref Range Status  02/18/2023 3.2 (L) 3.5 - 5.3 mmol/L Final         Failed - Na in normal range and within 180 days    Sodium  Date Value Ref Range Status  02/18/2023 143 135 - 146 mmol/L Final         Failed - Valid encounter within last 6 months    Recent Outpatient Visits   None     Future Appointments             In 2 months Baity, Rankin Buzzard, NP [place] [facility], [facility]            Passed - Last BP in normal range    BP Readings from Last 1 Encounters:  08/19/23 118/82

## 2023-12-29 ENCOUNTER — Ambulatory Visit: Payer: Self-pay

## 2023-12-29 NOTE — Telephone Encounter (Signed)
 Copied from CRM [id number]. Topic: Clinical - Red Word Triage >> Dec 29, 2023  8:36 AM Rosamond Comes wrote: Red Word that prompted transfer to Nurse Triage: patient husband Oti calling in. 3 days ago left shoulder pain, pain is getting worse a lot of pain  Chief Complaint: left shoulder pain, getting worse Symptoms: severe pain that does not improve Frequency: constant Pertinent Negatives: Patient denies chest pain, injury Disposition: [x] ED /[] Urgent Care (no appt availability in office) / [] Appointment(In office/virtual)/ []  [place] [facility]/ [] Home Care/ [] Refused Recommended Disposition /[] [place] [facility]/ []  Follow-up with PCP Additional Notes: to er; pcp office updated.   Reason for Disposition  [1] SEVERE pain AND [2] not improved 2 hours after pain medicine  Answer Assessment - Initial Assessment Questions 1. ONSET: "When did the pain start?"     3 days ago 2. LOCATION: "Where is the pain located?"     Left shoulder 3. PAIN: "How bad is the pain?" (Scale 1-10; or mild, moderate, severe)   - MILD (1-3): doesn't interfere with normal activities   - MODERATE (4-7): interferes with normal activities (e.g., work or school) or awakens from sleep   - SEVERE (8-10): excruciating pain, unable to do any normal activities, unable to move arm at all due to pain     severe 4. WORK OR EXERCISE: "Has there been any recent work or exercise that involved this part of the body?"     denies 5. CAUSE: "What do you think is causing the shoulder pain?"     Don't known 6. OTHER SYMPTOMS: "Do you have any other symptoms?" (e.g., neck pain, swelling, rash, fever, numbness, weakness)     denies 7. PREGNANCY: "Is there any chance you are pregnant?" "When was your last menstrual period?"     na  Protocols used: Shoulder Pain-A-AH

## 2024-01-03 ENCOUNTER — Other Ambulatory Visit: Payer: Self-pay | Admitting: Internal Medicine

## 2024-01-03 DIAGNOSIS — E785 Hyperlipidemia, unspecified: Secondary | ICD-10-CM

## 2024-01-06 NOTE — Telephone Encounter (Signed)
 Rx 10/27/23 #100- too soon Requested Prescriptions  Pending Prescriptions Disp Refills   atorvastatin  (LIPITOR) 10 MG tablet [Pharmacy Med Name: Atorvastatin  Calcium  10 MG Oral Tablet] 100 tablet 2    Sig: TAKE 1 TABLET BY MOUTH DAILY     Cardiovascular:  Antilipid - Statins Failed - 01/06/2024  1:28 PM      Failed - Valid encounter within last 12 months    Recent Outpatient Visits   None     Future Appointments             In 1 month Baity, Rankin Buzzard, NP [place] [facility]            Failed - Lipid Panel in normal range within the last 12 months    Cholesterol  Date Value Ref Range Status  02/18/2023 148 <200 mg/dL Final   LDL Cholesterol (Calc)  Date Value Ref Range Status  02/18/2023 64 mg/dL (calc) Final    Comment:    Reference range: <100 . Desirable range <100 mg/dL for primary prevention;   <70 mg/dL for patients with CHD or diabetic patients  with > or = 2 CHD risk factors. Aaron Aas LDL-C is now calculated using the Martin-Hopkins  calculation, which is a validated novel method providing  better accuracy than the Friedewald equation in the  estimation of LDL-C.  Melinda Sprawls et al. Erroll Heard. 6045;409(81): 2061-2068  (http://education.[facility].com/faq/FAQ164)    HDL  Date Value Ref Range Status  02/18/2023 69 > OR = 50 mg/dL Final   Triglycerides  Date Value Ref Range Status  02/18/2023 70 <150 mg/dL Final         Passed - Patient is not pregnant

## 2024-01-10 ENCOUNTER — Other Ambulatory Visit: Payer: Self-pay | Admitting: Internal Medicine

## 2024-01-14 NOTE — Telephone Encounter (Signed)
 Requested medication (s) are due for refill today: yes  Requested medication (s) are on the active medication list: yes  Last refill:  09/17/23 #90   Future visit scheduled: yes  Notes to clinic:  overdue repeat lab draw/abn Liver enzyme   Requested Prescriptions  Pending Prescriptions Disp Refills   meloxicam  (MOBIC ) 15 MG tablet [Pharmacy Med Name: MELOXICAM  15 MG TABLET] 90 tablet 0    Sig: TAKE 1 TABLET (15 MG TOTAL) BY MOUTH DAILY.     Analgesics:  COX2 Inhibitors Failed - 01/14/2024  9:17 AM      Failed - Manual Review: Labs are only required if the patient has taken medication for more than 8 weeks.      Failed - ALT in normal range and within 360 days    ALT  Date Value Ref Range Status  02/18/2023 30 (H) 6 - 29 U/L Final         Failed - Valid encounter within last 12 months    Recent Outpatient Visits   None     Future Appointments             In 1 month Baity, Rankin Buzzard, NP [place] [facility], [facility]            Passed - HGB in normal range and within 360 days    Hemoglobin  Date Value Ref Range Status  02/18/2023 14.1 11.7 - 15.5 g/dL Final         Passed - Cr in normal range and within 360 days    Creat  Date Value Ref Range Status  02/18/2023 0.78 0.50 - 1.05 mg/dL Final   Creatinine, Urine  Date Value Ref Range Status  02/18/2023 24 20 - 275 mg/dL Final         Passed - HCT in normal range and within 360 days    HCT  Date Value Ref Range Status  02/18/2023 44.6 35.0 - 45.0 % Final         Passed - AST in normal range and within 360 days    AST  Date Value Ref Range Status  02/18/2023 25 10 - 35 U/L Final         Passed - eGFR is 30 or above and within 360 days    GFR, Est African American  Date Value Ref Range Status  07/04/2020 70 > OR = 60 mL/min/1.45m2 Final   GFR, Est Non African American  Date Value Ref Range Status  07/04/2020 60 > OR = 60 mL/min/1.69m2 Final   eGFR  Date Value Ref Range Status   02/18/2023 84 > OR = 60 mL/min/1.33m2 Final         Passed - Patient is not pregnant

## 2024-01-17 ENCOUNTER — Other Ambulatory Visit: Payer: Self-pay | Admitting: Internal Medicine

## 2024-01-17 DIAGNOSIS — I10 Essential (primary) hypertension: Secondary | ICD-10-CM

## 2024-01-19 NOTE — Telephone Encounter (Signed)
 OV 08/19/23 Requested Prescriptions  Pending Prescriptions Disp Refills   amLODipine  (NORVASC ) 10 MG tablet [Pharmacy Med Name: amLODIPine  Besylate 10 MG Oral Tablet] 100 tablet 2    Sig: TAKE 1 TABLET BY MOUTH DAILY     Cardiovascular: Calcium  Channel Blockers 2 Failed - 01/19/2024  3:37 PM      Failed - Valid encounter within last 6 months    Recent Outpatient Visits   None     Future Appointments             In 1 month Baity, Rankin Buzzard, NP [place] [facility]            Passed - Last BP in normal range    BP Readings from Last 1 Encounters:  08/19/23 118/82         Passed - Last Heart Rate in normal range    Pulse Readings from Last 1 Encounters:  05/22/23 75

## 2024-02-18 ENCOUNTER — Ambulatory Visit: Payer: Self-pay | Admitting: Internal Medicine

## 2024-02-26 ENCOUNTER — Ambulatory Visit: Admitting: Internal Medicine

## 2024-03-05 ENCOUNTER — Ambulatory Visit: Admitting: Internal Medicine

## 2024-03-05 ENCOUNTER — Encounter: Payer: Self-pay | Admitting: Internal Medicine

## 2024-03-05 VITALS — BP 120/78 | Ht 66.0 in | Wt 183.2 lb

## 2024-03-05 DIAGNOSIS — E663 Overweight: Secondary | ICD-10-CM

## 2024-03-05 DIAGNOSIS — I10 Essential (primary) hypertension: Secondary | ICD-10-CM

## 2024-03-05 DIAGNOSIS — M85852 Other specified disorders of bone density and structure, left thigh: Secondary | ICD-10-CM

## 2024-03-05 DIAGNOSIS — E785 Hyperlipidemia, unspecified: Secondary | ICD-10-CM

## 2024-03-05 DIAGNOSIS — E1169 Type 2 diabetes mellitus with other specified complication: Secondary | ICD-10-CM | POA: Diagnosis not present

## 2024-03-05 DIAGNOSIS — M5442 Lumbago with sciatica, left side: Secondary | ICD-10-CM

## 2024-03-05 DIAGNOSIS — G8929 Other chronic pain: Secondary | ICD-10-CM

## 2024-03-05 DIAGNOSIS — E1165 Type 2 diabetes mellitus with hyperglycemia: Secondary | ICD-10-CM | POA: Diagnosis not present

## 2024-03-05 DIAGNOSIS — Z6829 Body mass index (BMI) 29.0-29.9, adult: Secondary | ICD-10-CM

## 2024-03-05 MED ORDER — HYDROCHLOROTHIAZIDE 50 MG PO TABS: 50.0000 mg | ORAL_TABLET | Freq: Every day | ORAL | 1 refills | Status: DC

## 2024-03-05 MED ORDER — AMLODIPINE BESYLATE 10 MG PO TABS: 10.0000 mg | ORAL_TABLET | Freq: Every day | ORAL | 0 refills | Status: DC

## 2024-03-05 MED ORDER — ATORVASTATIN CALCIUM 10 MG PO TABS: 10.0000 mg | ORAL_TABLET | Freq: Every day | ORAL | 0 refills | Status: DC

## 2024-03-05 NOTE — Assessment & Plan Note (Signed)
 Encouraged regular stretching and core strengthening Has meloxicam  15 mg to take if needed I am concerned that the steroid injection may have cause her sugars to increase She will continue to follow with orthopedics

## 2024-03-05 NOTE — Assessment & Plan Note (Signed)
 Continue calcium and vitamin D Encouraged daily weightbearing exercise

## 2024-03-05 NOTE — Assessment & Plan Note (Signed)
 Encouraged diet and exercise for weight loss ?

## 2024-03-05 NOTE — Assessment & Plan Note (Signed)
 CMET and lipid profile today Encouraged her to consume a low-fat diet Continue atorvastatin  10 mg daily

## 2024-03-05 NOTE — Progress Notes (Signed)
 Subjective:    Patient ID: Linda Guzman, female    DOB: 1956/06/09, 68 y.o.   MRN: [id number]  HPI  Patient presents to clinic today for 16-month follow-up of chronic conditions.  HTN: Her BP today is 128/76.  She is taking amlodipine  and HCTZ as prescribed.  ECG from 07/2018 reviewed.  HLD: Her last LDL was 64, triglycerides 70, 02/2023.  She denies myalgias on atorvastatin .  She tries to consume a low-fat diet.  DM2: Her last A1c was 6.8 %, 05/2023.  She is not taking any oral diabetic medication at this time.  She does not check her sugars.  She checks her feet routinely.  Her last eye exam was 01/2023, [facility] care.  Flu 07/2023.  Prevnar 20 07/2021.  COVID never.  Osteopenia: She is taking calcium  and vitamin D OTC.  She gets daily weightbearing exercise.  Bone density from 08/2018 reviewed.  Sciatica: Mainly on her left side.  She is taking meloxicam  as prescribed. She has had 3 steroid injections in her back.  X-ray lumbar spine from 01/2018 reviewed.  She follows with orthopedics.  Review of Systems     Past Medical History:  Diagnosis Date   Hypertension     Current Outpatient Medications  Medication Sig Dispense Refill   amLODipine  (NORVASC ) 10 MG tablet TAKE 1 TABLET BY MOUTH DAILY 100 tablet 0   atorvastatin  (LIPITOR) 10 MG tablet TAKE 1 TABLET BY MOUTH DAILY 100 tablet 0   cholecalciferol (VITAMIN D) 1000 units tablet Take 1,000 Units by mouth daily.     hydrochlorothiazide  (HYDRODIURIL ) 50 MG tablet TAKE 1 TABLET BY MOUTH EVERY DAY 90 tablet 0   Magnesium Oxide 250 MG TABS Take 250 mg by mouth daily.      meloxicam  (MOBIC ) 15 MG tablet TAKE 1 TABLET (15 MG TOTAL) BY MOUTH DAILY. 90 tablet 0   No current facility-administered medications for this visit.    Allergies  Allergen Reactions   Chloroquine Itching   Propofol      Patient had prolonged fatigue, weakness several weeks after propofol  use for colonoscopy 2020. - Patient is not truly allergic, but this med  should be used with caution in future    Family History  Problem Relation Age of Onset   Hypertension Mother    Stroke Sister    Breast cancer Neg Hx     Social History   Socioeconomic History   Marital status: Married    Spouse name: Not on file   Number of children: Not on file   Years of education: Not on file   Highest education level: Not on file  Occupational History   Not on file  Tobacco Use   Smoking status: Never   Smokeless tobacco: Never  Vaping Use   Vaping status: Never Used  Substance and Sexual Activity   Alcohol use: No   Drug use: No   Sexual activity: Yes    Birth control/protection: Post-menopausal  Other Topics Concern   Not on file  Social History Narrative   Not on file   Social Drivers of Health   Financial Resource Strain: Not on file  Food Insecurity: Not on file  Transportation Needs: Not on file  Physical Activity: Not on file  Stress: Not on file  Social Connections: Not on file  Intimate Partner Violence: Not on file     Constitutional: Denies fever, malaise, fatigue, headache or abrupt weight changes.  HEENT: Denies eye pain, eye redness, ear pain, ringing in  the ears, wax buildup, runny nose, nasal congestion, bloody nose, or sore throat. Respiratory: Denies difficulty breathing, shortness of breath, cough or sputum production.   Cardiovascular: Denies chest pain, chest tightness, palpitations or swelling in the hands or feet.  Gastrointestinal: Denies abdominal pain, bloating, constipation, diarrhea or blood in the stool.  GU: Denies urgency, frequency, pain with urination, burning sensation, blood in urine, odor or discharge. Musculoskeletal: Patient reports intermittent low back pain, left leg pain.  Denies decrease in range of motion, difficulty with gait, or joint swelling.  Skin: Denies redness, rashes, lesions or ulcercations.  Neurological: Denies dizziness, difficulty with memory, difficulty with speech or problems with  balance and coordination.  Psych: Denies anxiety, depression, SI/HI.  No other specific complaints in a complete review of systems (except as listed in HPI above).  Objective:   Physical Exam BP 120/78 (BP Location: Right Arm, Patient Position: Sitting, Cuff Size: Normal)   Ht 5' 6 (1.676 m)   Wt 183 lb 3.2 oz (83.1 kg)   BMI 29.57 kg/m    Wt Readings from Last 3 Encounters:  08/19/23 185 lb 6.4 oz (84.1 kg)  05/22/23 184 lb (83.5 kg)  05/07/23 181 lb (82.1 kg)    General: Appears her stated age, overweight,  in NAD. Skin: Warm, dry and intact. No ulcerations noted. HEENT: Head: normal shape and size; Eyes: sclera white, no icterus, conjunctiva pink, PERRLA and EOMs intact;  Cardiovascular: Normal rate and rhythm. S1,S2 noted.  No murmur, rubs or gallops noted. No JVD or BLE edema. No carotid bruits noted. Pulmonary/Chest: Normal effort and positive vesicular breath sounds. No respiratory distress. No wheezes, rales or ronchi noted.  Musculoskeletal: No bony tenderness noted over the lumbar spine.  Strength 5/5 BLE.  No difficulty with gait.  Neurological: Alert and oriented. Coordination normal.    BMET    Component Value Date/Time   NA 143 02/18/2023 0837   K 3.2 (L) 02/18/2023 0837   CL 101 02/18/2023 0837   CO2 29 02/18/2023 0837   GLUCOSE 132 (H) 02/18/2023 0837   BUN 9 02/18/2023 0837   CREATININE 0.78 02/18/2023 0837   CALCIUM  9.9 02/18/2023 0837   GFRNONAA 60 07/04/2020 0837   GFRAA 70 07/04/2020 0837    Lipid Panel     Component Value Date/Time   CHOL 148 02/18/2023 0837   TRIG 70 02/18/2023 0837   HDL 69 02/18/2023 0837   CHOLHDL 2.1 02/18/2023 0837   VLDL 20 01/08/2017 0904   LDLCALC 64 02/18/2023 0837    CBC    Component Value Date/Time   WBC 6.3 02/18/2023 0837   RBC 5.19 (H) 02/18/2023 0837   HGB 14.1 02/18/2023 0837   HCT 44.6 02/18/2023 0837   PLT 258 02/18/2023 0837   MCV 85.9 02/18/2023 0837   MCH 27.2 02/18/2023 0837   MCHC 31.6 (L)  02/18/2023 0837   RDW 13.3 02/18/2023 0837   LYMPHSABS 2,227 07/04/2020 0837   MONOABS 0.4 07/21/2018 1157   EOSABS 128 07/04/2020 0837   BASOSABS 43 07/04/2020 0837    Hgb A1C Lab Results  Component Value Date   HGBA1C 6.8 (A) 05/22/2023            Assessment & Plan:     RTC in 6 months for your annual exam Ang[place] Laura, NP

## 2024-03-05 NOTE — Assessment & Plan Note (Signed)
 Controlled on amlodipine  10 mg and hydrochlorothiazide  50 mg daily Reinforced DASH diet and exercise for weight loss C-Met today

## 2024-03-05 NOTE — Assessment & Plan Note (Signed)
 A1c and urine microalbumin today Not medicated I am concerned with the steroid injections in her back that this has increased her blood sugars Encouraged her to consume a low-carb diet and exercise for weight loss Encouraged routine foot exam Encouraged routine eye exam Immunizations UTD

## 2024-03-05 NOTE — Patient Instructions (Signed)

## 2024-03-06 ENCOUNTER — Ambulatory Visit: Payer: Self-pay | Admitting: Internal Medicine

## 2024-03-06 LAB — CBC
HCT: 45.4 % — ABNORMAL HIGH (ref 35.0–45.0)
Hemoglobin: 14.1 g/dL (ref 11.7–15.5)
MCH: 27.8 pg (ref 27.0–33.0)
MCHC: 31.1 g/dL — ABNORMAL LOW (ref 32.0–36.0)
MCV: 89.5 fL (ref 80.0–100.0)
MPV: 11.5 fL (ref 7.5–12.5)
Platelets: 252 Thousand/uL (ref 140–400)
RBC: 5.07 Million/uL (ref 3.80–5.10)
RDW: 14.2 % (ref 11.0–15.0)
WBC: 7.1 Thousand/uL (ref 3.8–10.8)

## 2024-03-06 LAB — COMPREHENSIVE METABOLIC PANEL WITH GFR
AG Ratio: 1.4 (calc) (ref 1.0–2.5)
ALT: 23 U/L (ref 6–29)
AST: 20 U/L (ref 10–35)
Albumin: 4.5 g/dL (ref 3.6–5.1)
Alkaline phosphatase (APISO): 67 U/L (ref 37–153)
BUN: 12 mg/dL (ref 7–25)
CO2: 28 mmol/L (ref 20–32)
Calcium: 10 mg/dL (ref 8.6–10.4)
Chloride: 99 mmol/L (ref 98–110)
Creat: 0.9 mg/dL (ref 0.50–1.05)
Globulin: 3.3 g/dL (ref 1.9–3.7)
Glucose, Bld: 120 mg/dL (ref 65–139)
Potassium: 3.7 mmol/L (ref 3.5–5.3)
Sodium: 140 mmol/L (ref 135–146)
Total Bilirubin: 0.4 mg/dL (ref 0.2–1.2)
Total Protein: 7.8 g/dL (ref 6.1–8.1)
eGFR: 70 mL/min/1.73m2 (ref >=60)

## 2024-03-06 LAB — MICROALBUMIN / CREATININE URINE RATIO
Creatinine, Urine: 29 mg/dL (ref 20–275)
Microalb Creat Ratio: 10 mg/g{creat} (ref <30)
Microalb, Ur: 0.3 mg/dL

## 2024-03-06 LAB — LIPID PANEL
Cholesterol: 213 mg/dL — ABNORMAL HIGH (ref <200)
HDL: 74 mg/dL (ref >=50)
LDL Cholesterol (Calc): 123 mg/dL — ABNORMAL HIGH
Non-HDL Cholesterol (Calc): 139 mg/dL — ABNORMAL HIGH (ref <130)
Total CHOL/HDL Ratio: 2.9 (calc) (ref <5.0)
Triglycerides: 66 mg/dL (ref <150)

## 2024-03-06 LAB — HEMOGLOBIN A1C
Hgb A1c MFr Bld: 7.3 % — ABNORMAL HIGH (ref <5.7)
Mean Plasma Glucose: 163 mg/dL
eAG (mmol/L): 9 mmol/L

## 2024-03-08 ENCOUNTER — Other Ambulatory Visit: Payer: Self-pay

## 2024-03-08 ENCOUNTER — Telehealth: Payer: Self-pay

## 2024-03-08 MED ORDER — METFORMIN HCL 500 MG PO TABS: 500.0000 mg | ORAL_TABLET | Freq: Two times a day (BID) | ORAL | 1 refills | Status: DC

## 2024-03-08 NOTE — Telephone Encounter (Signed)
 Copied from CRM [id number]. Topic: Clinical - Lab/Test Results >> Mar 08, 2024 11:45 AM Myrick T wrote: Reason for CRM: patients spouse called stated she did not understand her results and had some questions. Please f/u with patient or her husband

## 2024-03-08 NOTE — Telephone Encounter (Signed)
 Metformin sent to pharmacy

## 2024-04-21 ENCOUNTER — Other Ambulatory Visit: Payer: Self-pay | Admitting: Internal Medicine

## 2024-04-21 DIAGNOSIS — I10 Essential (primary) hypertension: Secondary | ICD-10-CM

## 2024-04-22 NOTE — Telephone Encounter (Signed)
 Requested Prescriptions  Refused Prescriptions Disp Refills   amLODipine  (NORVASC ) 10 MG tablet [Pharmacy Med Name: amLODIPine  Besylate 10 MG Oral Tablet] 100 tablet 2    Sig: TAKE 1 TABLET BY MOUTH DAILY     Cardiovascular: Calcium  Channel Blockers 2 Passed - 04/22/2024  1:22 PM      Passed - Last BP in normal range    BP Readings from Last 1 Encounters:  03/05/24 120/78         Passed - Last Heart Rate in normal range    Pulse Readings from Last 1 Encounters:  05/22/23 75         Passed - Valid encounter within last 6 months    Recent Outpatient Visits           1 month ago Type 2 diabetes mellitus with hyperglycemia, without long-term current use of insulin [facility])   [place] [facility] [place], Angeline ORN, [place]

## 2024-04-30 ENCOUNTER — Other Ambulatory Visit: Payer: Self-pay | Admitting: Internal Medicine

## 2024-04-30 NOTE — Telephone Encounter (Signed)
 Requested Prescriptions  Pending Prescriptions Disp Refills   meloxicam  (MOBIC ) 15 MG tablet [Pharmacy Med Name: MELOXICAM  15 MG TABLET] 90 tablet 0    Sig: TAKE 1 TABLET (15 MG TOTAL) BY MOUTH DAILY.     Analgesics:  COX2 Inhibitors Failed - 04/30/2024  2:25 PM      Failed - Manual Review: Labs are only required if the patient has taken medication for more than 8 weeks.      Failed - HCT in normal range and within 360 days    HCT  Date Value Ref Range Status  03/05/2024 45.4 (H) 35.0 - 45.0 % Final         Passed - HGB in normal range and within 360 days    Hemoglobin  Date Value Ref Range Status  03/05/2024 14.1 11.7 - 15.5 g/dL Final         Passed - Cr in normal range and within 360 days    Creat  Date Value Ref Range Status  03/05/2024 0.90 0.50 - 1.05 mg/dL Final   Creatinine, Urine  Date Value Ref Range Status  03/05/2024 29 20 - 275 mg/dL Final         Passed - AST in normal range and within 360 days    AST  Date Value Ref Range Status  03/05/2024 20 10 - 35 U/L Final         Passed - ALT in normal range and within 360 days    ALT  Date Value Ref Range Status  03/05/2024 23 6 - 29 U/L Final         Passed - eGFR is 30 or above and within 360 days    GFR, Est African American  Date Value Ref Range Status  07/04/2020 70 > OR = 60 mL/min/1.51m2 Final   GFR, Est Non African American  Date Value Ref Range Status  07/04/2020 60 > OR = 60 mL/min/1.34m2 Final   eGFR  Date Value Ref Range Status  03/05/2024 70 > OR = 60 mL/min/1.34m2 Final         Passed - Patient is not pregnant      Passed - Valid encounter within last 12 months    Recent Outpatient Visits           1 month ago Type 2 diabetes mellitus with hyperglycemia, without long-term current use of insulin [facility])   [place] [facility] [place], Angeline ORN, [place]

## 2024-06-01 ENCOUNTER — Other Ambulatory Visit: Payer: Self-pay | Admitting: Internal Medicine

## 2024-06-01 DIAGNOSIS — E785 Hyperlipidemia, unspecified: Secondary | ICD-10-CM

## 2024-06-03 NOTE — Telephone Encounter (Signed)
 Requested Prescriptions  Pending Prescriptions Disp Refills   atorvastatin  (LIPITOR) 10 MG tablet [Pharmacy Med Name: ATORVASTATIN  10 MG TABLET] 90 tablet 1    Sig: TAKE 1 TABLET BY MOUTH EVERY DAY     Cardiovascular:  Antilipid - Statins Failed - 06/03/2024 11:14 AM      Failed - Lipid Panel in normal range within the last 12 months    Cholesterol  Date Value Ref Range Status  03/05/2024 213 (H) <200 mg/dL Final   LDL Cholesterol (Calc)  Date Value Ref Range Status  03/05/2024 123 (H) mg/dL (calc) Final    Comment:    Reference range: <100 . Desirable range <100 mg/dL for primary prevention;   <70 mg/dL for patients with CHD or diabetic patients  with > or = 2 CHD risk factors. SABRA LDL-C is now calculated using the Martin-Hopkins  calculation, which is a validated novel method providing  better accuracy than the Friedewald equation in the  estimation of LDL-C.  Gladis APPLETHWAITE et al. SANDREA. 7986;689(80): 2061-2068  (http://education.[facility].com/faq/FAQ164)    HDL  Date Value Ref Range Status  03/05/2024 74 > OR = 50 mg/dL Final   Triglycerides  Date Value Ref Range Status  03/05/2024 66 <150 mg/dL Final         Passed - Patient is not pregnant      Passed - Valid encounter within last 12 months    Recent Outpatient Visits           3 months ago Type 2 diabetes mellitus with hyperglycemia, without long-term current use of insulin [facility])   [place] [facility] [place], Angeline ORN, [place]

## 2024-08-17 ENCOUNTER — Other Ambulatory Visit: Payer: Self-pay | Admitting: Internal Medicine

## 2024-08-17 DIAGNOSIS — I10 Essential (primary) hypertension: Secondary | ICD-10-CM

## 2024-08-18 NOTE — Telephone Encounter (Signed)
 Requested by interface surescripts. Future visit. 08/24/24.   Requested Prescriptions  Pending Prescriptions Disp Refills   amLODipine  (NORVASC ) 10 MG tablet [Pharmacy Med Name: AMLODIPINE  BESYLATE 10 MG TAB] 90 tablet 0    Sig: TAKE 1 TABLET BY MOUTH EVERY DAY     Cardiovascular: Calcium  Channel Blockers 2 Passed - 08/18/2024 12:25 PM      Passed - Last BP in normal range    BP Readings from Last 1 Encounters:  03/05/24 120/78         Passed - Last Heart Rate in normal range    Pulse Readings from Last 1 Encounters:  05/22/23 75         Passed - Valid encounter within last 6 months    Recent Outpatient Visits           5 months ago Type 2 diabetes mellitus with hyperglycemia, without long-term current use of insulin [facility])   [place] [facility] [place], Angeline ORN, [place]

## 2024-08-20 ENCOUNTER — Ambulatory Visit

## 2024-08-24 ENCOUNTER — Encounter: Admitting: Internal Medicine

## 2024-08-27 ENCOUNTER — Other Ambulatory Visit: Payer: Self-pay | Admitting: Internal Medicine

## 2024-08-27 DIAGNOSIS — I10 Essential (primary) hypertension: Secondary | ICD-10-CM

## 2024-08-27 NOTE — Telephone Encounter (Signed)
 Requested Prescriptions  Pending Prescriptions Disp Refills   meloxicam  (MOBIC ) 15 MG tablet [Pharmacy Med Name: MELOXICAM  15 MG TABLET] 90 tablet 0    Sig: TAKE 1 TABLET (15 MG TOTAL) BY MOUTH DAILY.     Analgesics:  COX2 Inhibitors Failed - 08/27/2024  3:14 PM      Failed - Manual Review: Labs are only required if the patient has taken medication for more than 8 weeks.      Failed - HCT in normal range and within 360 days    HCT  Date Value Ref Range Status  03/05/2024 45.4 (H) 35.0 - 45.0 % Final         Passed - HGB in normal range and within 360 days    Hemoglobin  Date Value Ref Range Status  03/05/2024 14.1 11.7 - 15.5 g/dL Final         Passed - Cr in normal range and within 360 days    Creat  Date Value Ref Range Status  03/05/2024 0.90 0.50 - 1.05 mg/dL Final   Creatinine, Urine  Date Value Ref Range Status  03/05/2024 29 20 - 275 mg/dL Final         Passed - AST in normal range and within 360 days    AST  Date Value Ref Range Status  03/05/2024 20 10 - 35 U/L Final         Passed - ALT in normal range and within 360 days    ALT  Date Value Ref Range Status  03/05/2024 23 6 - 29 U/L Final         Passed - eGFR is 30 or above and within 360 days    GFR, Est African American  Date Value Ref Range Status  07/04/2020 70 > OR = 60 mL/min/1.71m2 Final   GFR, Est Non African American  Date Value Ref Range Status  07/04/2020 60 > OR = 60 mL/min/1.72m2 Final   eGFR  Date Value Ref Range Status  03/05/2024 70 > OR = 60 mL/min/1.69m2 Final         Passed - Patient is not pregnant      Passed - Valid encounter within last 12 months    Recent Outpatient Visits           5 months ago Type 2 diabetes mellitus with hyperglycemia, without long-term current use of insulin (HCC)   [place] [facility] [place], [place] W, NP               hydrochlorothiazide  (HYDRODIURIL ) 50 MG tablet [Pharmacy Med Name: HYDROCHLOROTHIAZIDE  50 MG TAB] 90  tablet 0    Sig: TAKE 1 TABLET BY MOUTH EVERY DAY     Cardiovascular: Diuretics - Thiazide Passed - 08/27/2024  3:14 PM      Passed - Cr in normal range and within 180 days    Creat  Date Value Ref Range Status  03/05/2024 0.90 0.50 - 1.05 mg/dL Final   Creatinine, Urine  Date Value Ref Range Status  03/05/2024 29 20 - 275 mg/dL Final         Passed - K in normal range and within 180 days    Potassium  Date Value Ref Range Status  03/05/2024 3.7 3.5 - 5.3 mmol/L Final         Passed - Na in normal range and within 180 days    Sodium  Date Value Ref Range Status  03/05/2024 140 135 - 146 mmol/L  Final         Passed - Last BP in normal range    BP Readings from Last 1 Encounters:  03/05/24 120/78         Passed - Valid encounter within last 6 months    Recent Outpatient Visits           5 months ago Type 2 diabetes mellitus with hyperglycemia, without long-term current use of insulin [facility])   [facility] [place], Angeline ORN, [place]

## 2024-09-01 ENCOUNTER — Ambulatory Visit: Admitting: Emergency Medicine

## 2024-09-01 VITALS — BP 102/62 | Ht 66.0 in | Wt 175.2 lb

## 2024-09-01 DIAGNOSIS — Z1231 Encounter for screening mammogram for malignant neoplasm of breast: Secondary | ICD-10-CM | POA: Diagnosis not present

## 2024-09-01 DIAGNOSIS — Z23 Encounter for immunization: Secondary | ICD-10-CM

## 2024-09-01 DIAGNOSIS — Z780 Asymptomatic menopausal state: Secondary | ICD-10-CM | POA: Diagnosis not present

## 2024-09-01 DIAGNOSIS — Z0000 Encounter for general adult medical examination without abnormal findings: Secondary | ICD-10-CM | POA: Diagnosis not present

## 2024-09-01 NOTE — Progress Notes (Signed)
 "  Chief Complaint  Patient presents with   Medicare Wellness     Subjective:   Linda Guzman is a 69 y.o. female who presents for a Medicare Annual Wellness Visit.  Visit info / Clinical Intake: Medicare Wellness Visit Type:: Subsequent Annual Wellness Visit Persons participating in visit and providing information:: patient & caregiver; patient (Linda Guzman, husband was present during today's visit) Medicare Wellness Visit Mode:: In-person (required for WTM) Interpreter Needed?: No Pre-visit prep was completed: yes AWV questionnaire completed by patient prior to visit?: no Living arrangements:: with family/others Patient's Overall Health Status Rating: good Typical amount of pain: (!) a lot Does pain affect daily life?: no Are you currently prescribed opioids?: no  Dietary Habits and Nutritional Risks How many meals a day?: 2 Eats fruit and vegetables daily?: yes Most meals are obtained by: preparing own meals In the last 2 weeks, have you had any of the following?: unintentional weight loss (lost 10lbs in the last 2 months) Diabetic:: (!) yes Any non-healing wounds?: no How often do you check your BS?: 0 Would you like to be referred to a Nutritionist or for Diabetic Management? : no  Functional Status Activities of Daily Living (to include ambulation/medication): Independent Ambulation: Independent Medication Administration: Independent Home Management (perform basic housework or laundry): Independent Manage your own finances?: (!) no (husband manages finances) Primary transportation is: family / friends Concerns about vision?: no *vision screening is required for WTM* Concerns about hearing?: no  Fall Screening Falls in the past year?: 0 Number of falls in past year: 0 Was there an injury with Fall?: 0 Fall Risk Category Calculator: 0 Patient Fall Risk Level: Low Fall Risk  Fall Risk Patient at Risk for Falls Due to: No Fall Risks Fall risk Follow up: Falls evaluation  completed  Home and Transportation Safety: All rugs have non-skid backing?: yes All stairs or steps have railings?: (!) no (1 step without handrail) Grab bars in the bathtub or shower?: (!) no Have non-skid surface in bathtub or shower?: (!) no Good home lighting?: yes Regular seat belt use?: yes Hospital stays in the last year:: no  Cognitive Assessment Difficulty concentrating, remembering, or making decisions? : no Will 6CIT or Mini Cog be Completed: yes What year is it?: 0 points What month is it?: 0 points Give patient an address phrase to remember (5 components): [address] [place] About what time is it?: 0 points Count backwards from 20 to 1: 0 points Say the months of the year in reverse: 0 points Repeat the address phrase from earlier: 2 points 6 CIT Score: 2 points  Advance Directives (For Healthcare) Does Patient Have a Medical Advance Directive?: No Would patient like information on creating a medical advance directive?: No - Patient declined  Reviewed/Updated  Reviewed/Updated: Reviewed All (Medical, Surgical, Family, Medications, Allergies, Care Teams, Patient Goals)    Allergies (verified) Chloroquine and Propofol    Current Medications (verified) Outpatient Encounter Medications as of 09/01/2024  Medication Sig   amLODipine  (NORVASC ) 10 MG tablet TAKE 1 TABLET BY MOUTH EVERY DAY   atorvastatin  (LIPITOR) 10 MG tablet TAKE 1 TABLET BY MOUTH EVERY DAY   cholecalciferol (VITAMIN D) 1000 units tablet Take 1,000 Units by mouth daily.   hydrochlorothiazide  (HYDRODIURIL ) 50 MG tablet TAKE 1 TABLET BY MOUTH EVERY DAY   latanoprost (XALATAN) 0.005 % ophthalmic solution Place 1 drop into both eyes at bedtime.   meloxicam  (MOBIC ) 15 MG tablet TAKE 1 TABLET (15 MG TOTAL) BY MOUTH DAILY.  metFORMIN  (GLUCOPHAGE ) 500 MG tablet Take 1 tablet (500 mg total) by mouth 2 (two) times daily with a meal.   No facility-administered encounter medications on file as of  09/01/2024.    History: Past Medical History:  Diagnosis Date   Hypertension    Past Surgical History:  Procedure Laterality Date   COLONOSCOPY WITH PROPOFOL  N/A 05/05/2018   Procedure: COLONOSCOPY WITH PROPOFOL ;  Surgeon: Jinny Carmine, MD;  Location: [facility] END[facility]COPY;  Service: Endoscopy;  Laterality: N/A;   COLONOSCOPY WITH PROPOFOL  N/A 10/27/2018   Procedure: COLONOSCOPY WITH PROPOFOL ;  Surgeon: Jinny Carmine, MD;  Location: [facility] ENDOSCOPY;  Service: Endoscopy;  Laterality: N/A;   COLONOSCOPY WITH PROPOFOL  N/A 09/10/2022   Procedure: COLONOSCOPY WITH PROPOFOL ;  Surgeon: Jinny Carmine, MD;  Location: [facility] ENDOSCOPY;  Service: Endoscopy;  Laterality: N/A;   FLEXIBLE SIGMOIDOSCOPY N/A 07/27/2018   Procedure: FLEXIBLE SIGMOIDOSCOPY;  Surgeon: Teresa Lonni HERO, MD;  Location: [facility];  Service: General;  Laterality: N/A;   NO PAST SURGERIES     TRANSANAL EXCISION OF RECTAL MASS N/A 07/27/2018   Procedure: TRANSANAL EXCISON OF RECTAL POLYP;  Surgeon: Teresa Lonni HERO, MD;  Location: [facility];  Service: General;  Laterality: N/A;   Family History  Problem Relation Age of Onset   Hypertension Mother    Stroke Sister    Breast cancer Neg Hx    Social History   Occupational History   Not on file  Tobacco Use   Smoking status: Never   Smokeless tobacco: Never  Vaping Use   Vaping status: Never Used  Substance and Sexual Activity   Alcohol use: No   Drug use: No   Sexual activity: Yes    Birth control/protection: Post-menopausal   Tobacco Counseling Counseling given: Not Answered  SDOH Screenings   Food Insecurity: No Food Insecurity (09/01/2024)  Housing: Low Risk (09/01/2024)  Transportation Needs: No Transportation Needs (09/01/2024)  Utilities: Not At Risk (09/01/2024)  Depression (PHQ2-9): Medium Risk (09/01/2024)  Physical Activity: Inactive (09/01/2024)  Social Connections: Moderately Integrated (09/01/2024)  Stress: No Stress Concern Present (09/01/2024)  Tobacco Use: Low  Risk (09/01/2024)  Health Literacy: Adequate Health Literacy (09/01/2024)   See flowsheets for full screening details  Depression Screen PHQ 2 & 9 Depression Scale- Over the past 2 weeks, how often have you been bothered by any of the following problems? Little interest or pleasure in doing things: 0 Feeling down, depressed, or hopeless (PHQ Adolescent also includes...irritable): 0 PHQ-2 Total Score: 0 Trouble falling or staying asleep, or sleeping too much: 3 Feeling tired or having little energy: 0 Poor appetite or overeating (PHQ Adolescent also includes...weight loss): 3 Feeling bad about yourself - or that you are a failure or have let yourself or your family down: 0 Trouble concentrating on things, such as reading the newspaper or watching television (PHQ Adolescent also includes...like school work): 0 Moving or speaking so slowly that other people could have noticed. Or the opposite - being so fidgety or restless that you have been moving around a lot more than usual: 0 Thoughts that you would be better off dead, or of hurting yourself in some way: 0 PHQ-9 Total Score: 6 If you checked off any problems, how difficult have these problems made it for you to do your work, take care of things at home, or get along with other people?: Somewhat difficult  Depression Treatment Depression Interventions/Treatment : Patient refuses Treatment (wants to discuss with PCP at next OV on 09/22/24)     Goals  Addressed               This Visit's Progress     Manage diabetes (pt-stated)               Objective:    Today's Vitals   09/01/24 1543  BP: 102/62  Weight: 175 lb 3.2 oz (79.5 kg)  Height: 5' 6 (1.676 m)   Body mass index is 28.28 kg/m.  Hearing/Vision screen Hearing Screening - Comments:: Denies hearing loss  Vision Screening - Comments:: Needs DM eye exam. Patient to call Dr. Francis Mallick to schedule Immunizations and Health Maintenance Health Maintenance  Topic Date  Due   Zoster Vaccines- Shingrix (1 of 2) Never done   Mammogram  09/12/2019   Bone Density Scan  09/08/2023   OPHTHALMOLOGY EXAM  09/01/2024   HEMOGLOBIN A1C  09/05/2024   Diabetic kidney evaluation - eGFR measurement  03/05/2025   Diabetic kidney evaluation - Urine ACR  03/05/2025   FOOT EXAM  03/05/2025   Medicare Annual Wellness (AWV)  09/01/2025   Colonoscopy  09/11/2027   DTaP/Tdap/Td (3 - Td or Tdap) 07/26/2031   Pneumococcal Vaccine: 50+ Years  Completed   Influenza Vaccine  Completed   Hepatitis C Screening  Completed   Meningococcal B Vaccine  Aged Out   COVID-19 Vaccine  Discontinued        Assessment/Plan:  This is a routine wellness examination for Linda Guzman.  Patient Care Team: Antonette [place] ORN, NP as PCP - General (Internal Medicine) Dow Frederic BIRCH, MD as Referring Physician (Orthopedic Surgery) Mallick Francis NOVAK, OD [facility])  I have personally reviewed and noted the following in the patients chart:   Medical and social history Use of alcohol, tobacco or illicit drugs  Current medications and supplements including opioid prescriptions. Functional ability and status Nutritional status Physical activity Advanced directives List of other physicians Hospitalizations, surgeries, and ER visits in previous 12 months Vitals Screenings to include cognitive, depression, and falls Referrals and appointments  Orders Placed This Encounter  Procedures   MM 3D SCREENING MAMMOGRAM BILATERAL BREAST    Standing Status:   Future    Expected Date:   09/02/2024    Expiration Date:   09/01/2025    Scheduling Instructions:     Would like to have same visit as DEXA    Reason for Exam (SYMPTOM  OR DIAGNOSIS REQUIRED):   screening for breast cancer    Preferred imaging location?:   [facility]   DG Bone Density    Standing Status:   Future    Expected Date:   09/02/2024    Expiration Date:   09/01/2025    Scheduling Instructions:     Would like to have same visit as MMG     Reason for Exam (SYMPTOM  OR DIAGNOSIS REQUIRED):   postmenopausal estrogen deficiency    Preferred imaging location?:   [facility]   Flu vaccine HIGH DOSE PF(Fluzone Trivalent)   In addition, I have reviewed and discussed with patient certain preventive protocols, quality metrics, and best practice recommendations. A written personalized care plan for preventive services as well as general preventive health recommendations were provided to patient.   Vina Ned, CMA   09/01/2024   Return in 54 weeks (on 09/14/2025) for Medicare Annual Wellness Visit.  After Visit Summary: (In Person-Printed) AVS printed and given to the patient  Nurse Notes:  6 CIT Score - 2 Gave flu vaccine today Needs DM eye exam. Patient to call  Dr. Francis Mallick to schedule. Placed orders for MMG and DEXA Declined covid and shingles vaccines Declined DM & Nutrition education at this time "

## 2024-09-01 NOTE — Patient Instructions (Signed)
 Ms. Bourcier,  Thank you for taking the time for your Medicare Wellness Visit. I appreciate your continued commitment to your health goals. Please review the care plan we discussed, and feel free to reach out if I can assist you further.  Please note that Annual Wellness Visits do not include a physical exam. Some assessments may be limited, especially if the visit was conducted virtually. If needed, we may recommend an in-person follow-up with your provider.  Ongoing Care Seeing your primary care provider every 3 to 6 months helps us  monitor your health and provide consistent, personalized care.   Referrals If a referral was made during today's visit and you haven't received any updates within two weeks, please contact the referred provider directly to check on the status.  Recommended Screenings:  Call to schedule a diabetic eye exam with Dr. Francis Mallick. You should have this every year.  I have given you the flu vaccine today.  Please call to schedule your mammogram and bone density scan:  [facility] Address: [address], [place], [place] Phone: [phone]    Health Maintenance  Topic Date Due   Medicare Annual Wellness Visit  Never done   Zoster (Shingles) Vaccine (1 of 2) Never done   Breast Cancer Screening  09/12/2019   Osteoporosis screening with Bone Density Scan  09/08/2023   Eye exam for diabetics  09/01/2024   Hemoglobin A1C  09/05/2024   Yearly kidney function blood test for diabetes  03/05/2025   Yearly kidney health urinalysis for diabetes  03/05/2025   Complete foot exam   03/05/2025   Colon Cancer Screening  09/11/2027   DTaP/Tdap/Td vaccine (3 - Td or Tdap) 07/26/2031   Pneumococcal Vaccine for age over 64  Completed   Flu Shot  Completed   Hepatitis C Screening  Completed   Meningitis B Vaccine  Aged Out   COVID-19 Vaccine  Discontinued       09/01/2024    3:55 PM  Advanced Directives  Does Patient  Have a Medical Advance Directive? No  Would patient like information on creating a medical advance directive? No - Patient declined    Vision: Annual vision screenings are recommended for early detection of glaucoma, cataracts, and diabetic retinopathy. These exams can also reveal signs of chronic conditions such as diabetes and high blood pressure.  Dental: Annual dental screenings help detect early signs of oral cancer, gum disease, and other conditions linked to overall health, including heart disease and diabetes.  Please see the attached documents for additional preventive care recommendations.

## 2024-09-05 ENCOUNTER — Other Ambulatory Visit: Payer: Self-pay | Admitting: Internal Medicine

## 2024-09-06 NOTE — Telephone Encounter (Signed)
 Requested Prescriptions  Pending Prescriptions Disp Refills   metFORMIN  (GLUCOPHAGE ) 500 MG tablet [Pharmacy Med Name: METFORMIN  HCL 500 MG TABLET] 180 tablet 0    Sig: TAKE 1 TABLET BY MOUTH 2 TIMES DAILY WITH A MEAL.     Endocrinology:  Diabetes - Biguanides Failed - 09/06/2024  3:46 PM      Failed - HBA1C is between 0 and 7.9 and within 180 days    HbA1c, POC (controlled diabetic range)  Date Value Ref Range Status  11/04/2022 6.4 0.0 - 7.0 % Final   HbA1c POC (<> result, manual entry)  Date Value Ref Range Status  02/18/2023 7 4.0 - 5.6 % Final   Hgb A1c MFr Bld  Date Value Ref Range Status  03/05/2024 7.3 (H) <5.7 % Final    Comment:    For someone without known diabetes, a hemoglobin A1c value of 6.5% or greater indicates that they may have  diabetes and this should be confirmed with a follow-up  test. . For someone with known diabetes, a value <7% indicates  that their diabetes is well controlled and a value  greater than or equal to 7% indicates suboptimal  control. A1c targets should be individualized based on  duration of diabetes, age, comorbid conditions, and  other considerations. . Currently, no consensus exists regarding use of hemoglobin A1c for diagnosis of diabetes for children. .          Failed - B12 Level in normal range and within 720 days    No results found for: VITAMINB12       Failed - CBC within normal limits and completed in the last 12 months    WBC  Date Value Ref Range Status  03/05/2024 7.1 3.8 - 10.8 Thousand/uL Final   RBC  Date Value Ref Range Status  03/05/2024 5.07 3.80 - 5.10 Million/uL Final   Hemoglobin  Date Value Ref Range Status  03/05/2024 14.1 11.7 - 15.5 g/dL Final   HCT  Date Value Ref Range Status  03/05/2024 45.4 (H) 35.0 - 45.0 % Final   MCHC  Date Value Ref Range Status  03/05/2024 31.1 (L) 32.0 - 36.0 g/dL Final    Comment:    For adults, a slight decrease in the calculated MCHC value (in the range of  30 to 32 g/dL) is most likely not clinically significant; however, it should be interpreted with caution in correlation with other red cell parameters and the patient's clinical condition.    [facility]  Date Value Ref Range Status  03/05/2024 27.8 27.0 - 33.0 pg Final   MCV  Date Value Ref Range Status  03/05/2024 89.5 80.0 - 100.0 fL Final   No results found for: PLTCOUNTKUC, LABPLAT, POCPLA RDW  Date Value Ref Range Status  03/05/2024 14.2 11.0 - 15.0 % Final         Passed - Cr in normal range and within 360 days    Creat  Date Value Ref Range Status  03/05/2024 0.90 0.50 - 1.05 mg/dL Final   Creatinine, Urine  Date Value Ref Range Status  03/05/2024 29 20 - 275 mg/dL Final         Passed - eGFR in normal range and within 360 days    GFR, Est African American  Date Value Ref Range Status  07/04/2020 70 > OR = 60 mL/min/1.85m2 Final   GFR, Est Non African American  Date Value Ref Range Status  07/04/2020 60 > OR = 60 mL/min/1.65m2 Final  eGFR  Date Value Ref Range Status  03/05/2024 70 > OR = 60 mL/min/1.62m2 Final         Passed - Valid encounter within last 6 months    Recent Outpatient Visits           6 months ago Type 2 diabetes mellitus with hyperglycemia, without long-term current use of insulin [facility])   [place] [place], [place]N, [place]

## 2024-09-22 ENCOUNTER — Encounter: Payer: Self-pay | Admitting: Internal Medicine

## 2024-09-22 ENCOUNTER — Ambulatory Visit: Admitting: Internal Medicine

## 2024-09-22 VITALS — BP 112/68 | Ht 66.0 in | Wt 169.2 lb

## 2024-09-22 DIAGNOSIS — Z0001 Encounter for general adult medical examination with abnormal findings: Secondary | ICD-10-CM

## 2024-09-22 DIAGNOSIS — E1165 Type 2 diabetes mellitus with hyperglycemia: Secondary | ICD-10-CM

## 2024-09-22 DIAGNOSIS — R634 Abnormal weight loss: Secondary | ICD-10-CM

## 2024-09-22 DIAGNOSIS — E1169 Type 2 diabetes mellitus with other specified complication: Secondary | ICD-10-CM

## 2024-09-22 LAB — COMPREHENSIVE METABOLIC PANEL WITH GFR
AG Ratio: 1.5 (calc) (ref 1.0–2.5)
ALT: 18 U/L (ref 6–29)
AST: 19 U/L (ref 10–35)
Albumin: 4.5 g/dL (ref 3.6–5.1)
Alkaline phosphatase (APISO): 64 U/L (ref 37–153)
BUN: 11 mg/dL (ref 7–25)
CO2: 30 mmol/L (ref 20–32)
Calcium: 9.9 mg/dL (ref 8.6–10.4)
Chloride: 101 mmol/L (ref 98–110)
Creat: 0.81 mg/dL (ref 0.50–1.05)
Globulin: 3.1 g/dL (ref 1.9–3.7)
Glucose, Bld: 132 mg/dL — ABNORMAL HIGH (ref 65–99)
Potassium: 3.7 mmol/L (ref 3.5–5.3)
Sodium: 141 mmol/L (ref 135–146)
Total Bilirubin: 0.5 mg/dL (ref 0.2–1.2)
Total Protein: 7.6 g/dL (ref 6.1–8.1)
eGFR: 79 mL/min/{1.73_m2}

## 2024-09-22 LAB — CBC
HCT: 43.6 % (ref 35.9–46.0)
Hemoglobin: 13.8 g/dL (ref 11.7–15.5)
MCH: 27.3 pg (ref 27.0–33.0)
MCHC: 31.7 g/dL (ref 31.6–35.4)
MCV: 86.2 fL (ref 81.4–101.7)
MPV: 11.5 fL (ref 7.5–12.5)
Platelets: 284 10*3/uL (ref 140–400)
RBC: 5.06 Million/uL (ref 3.80–5.10)
RDW: 13.4 % (ref 11.0–15.0)
WBC: 5.8 10*3/uL (ref 3.8–10.8)

## 2024-09-22 LAB — HEMOGLOBIN A1C
Hgb A1c MFr Bld: 6.6 % — ABNORMAL HIGH
Mean Plasma Glucose: 143 mg/dL
eAG (mmol/L): 7.9 mmol/L

## 2024-09-22 LAB — LIPID PANEL
Cholesterol: 181 mg/dL
HDL: 63 mg/dL
LDL Cholesterol (Calc): 102 mg/dL — ABNORMAL HIGH
Non-HDL Cholesterol (Calc): 118 mg/dL
Total CHOL/HDL Ratio: 2.9 (calc)
Triglycerides: 70 mg/dL

## 2024-09-22 LAB — TSH: TSH: 1.22 m[IU]/L (ref 0.40–4.50)

## 2024-09-22 NOTE — Assessment & Plan Note (Signed)
 A1c today Urine microalbumin has been checked within the last year Continue metformin  500 mg twice daily Encouraged her to consume a low-carb diet and exercise for weight loss Encouraged routine foot exam Encouraged routine eye exam Immunizations UTD

## 2024-09-22 NOTE — Patient Instructions (Signed)
 Health Maintenance for Postmenopausal Women Menopause is a normal process in which your ability to get pregnant comes to an end. This process happens slowly over many months or years, usually between the ages of 76 and 38. Menopause is complete when you have missed your menstrual period for 12 months. It is important to talk with your health care provider about some of the most common conditions that affect women after menopause (postmenopausal women). These include heart disease, cancer, and bone loss (osteoporosis). Adopting a healthy lifestyle and getting preventive care can help to promote your health and wellness. The actions you take can also lower your chances of developing some of these common conditions. What are the signs and symptoms of menopause? During menopause, you may have the following symptoms: Hot flashes. These can be moderate or severe. Night sweats. Decrease in sex drive. Mood swings. Headaches. Tiredness (fatigue). Irritability. Memory problems. Problems falling asleep or staying asleep. Talk with your health care provider about treatment options for your symptoms. Do I need hormone replacement therapy? Hormone replacement therapy is effective in treating symptoms that are caused by menopause, such as hot flashes and night sweats. Hormone replacement carries certain risks, especially as you become older. If you are thinking about using estrogen or estrogen with progestin, discuss the benefits and risks with your health care provider. How can I reduce my risk for heart disease and stroke? The risk of heart disease, heart attack, and stroke increases as you age. One of the causes may be a change in the body's hormones during menopause. This can affect how your body uses dietary fats, triglycerides, and cholesterol. Heart attack and stroke are medical emergencies. There are many things that you can do to help prevent heart disease and stroke. Watch your blood pressure High  blood pressure causes heart disease and increases the risk of stroke. This is more likely to develop in people who have high blood pressure readings or are overweight. Have your blood pressure checked: Every 3-5 years if you are 32-23 years of age. Every year if you are 31 years old or older. Eat a healthy diet  Eat a diet that includes plenty of vegetables, fruits, low-fat dairy products, and lean protein. Do not eat a lot of foods that are high in solid fats, added sugars, or sodium. Get regular exercise Get regular exercise. This is one of the most important things you can do for your health. Most adults should: Try to exercise for at least 150 minutes each week. The exercise should increase your heart rate and make you sweat (moderate-intensity exercise). Try to do strengthening exercises at least twice each week. Do these in addition to the moderate-intensity exercise. Spend less time sitting. Even light physical activity can be beneficial. Other tips Work with your health care provider to achieve or maintain a healthy weight. Do not use any products that contain nicotine or tobacco. These products include cigarettes, chewing tobacco, and vaping devices, such as e-cigarettes. If you need help quitting, ask your health care provider. Know your numbers. Ask your health care provider to check your cholesterol and your blood sugar (glucose). Continue to have your blood tested as directed by your health care provider. Do I need screening for cancer? Depending on your health history and family history, you may need to have cancer screenings at different stages of your life. This may include screening for: Breast cancer. Cervical cancer. Lung cancer. Colorectal cancer. What is my risk for osteoporosis? After menopause, you may be  at increased risk for osteoporosis. Osteoporosis is a condition in which bone destruction happens more quickly than new bone creation. To help prevent osteoporosis or  the bone fractures that can happen because of osteoporosis, you may take the following actions: If you are 24-54 years old, get at least 1,000 mg of calcium and at least 600 international units (IU) of vitamin D  per day. If you are older than age 75 but younger than age 30, get at least 1,200 mg of calcium and at least 600 international units (IU) of vitamin D  per day. If you are older than age 8, get at least 1,200 mg of calcium and at least 800 international units (IU) of vitamin D  per day. Smoking and drinking excessive alcohol increase the risk of osteoporosis. Eat foods that are rich in calcium and vitamin D , and do weight-bearing exercises several times each week as directed by your health care provider. How does menopause affect my mental health? Depression may occur at any age, but it is more common as you become older. Common symptoms of depression include: Feeling depressed. Changes in sleep patterns. Changes in appetite or eating patterns. Feeling an overall lack of motivation or enjoyment of activities that you previously enjoyed. Frequent crying spells. Talk with your health care provider if you think that you are experiencing any of these symptoms. General instructions See your health care provider for regular wellness exams and vaccines. This may include: Scheduling regular health, dental, and eye exams. Getting and maintaining your vaccines. These include: Influenza vaccine. Get this vaccine each year before the flu season begins. Pneumonia vaccine. Shingles vaccine. Tetanus, diphtheria, and pertussis (Tdap) booster vaccine. Your health care provider may also recommend other immunizations. Tell your health care provider if you have ever been abused or do not feel safe at home. Summary Menopause is a normal process in which your ability to get pregnant comes to an end. This condition causes hot flashes, night sweats, decreased interest in sex, mood swings, headaches, or lack  of sleep. Treatment for this condition may include hormone replacement therapy. Take actions to keep yourself healthy, including exercising regularly, eating a healthy diet, watching your weight, and checking your blood pressure and blood sugar levels. Get screened for cancer and depression. Make sure that you are up to date with all your vaccines. This information is not intended to replace advice given to you by your health care provider. Make sure you discuss any questions you have with your health care provider. Document Revised: 12/25/2020 Document Reviewed: 12/25/2020 Elsevier Patient Education  2024 [employer].

## 2024-09-22 NOTE — Assessment & Plan Note (Signed)
 CMET and lipid profile today Encouraged her to consume a low-fat diet Continue atorvastatin  10 mg daily

## 2024-09-22 NOTE — Progress Notes (Signed)
 "  Subjective:    Patient ID: Linda Guzman, female    DOB: 14-May-1956, 69 y.o.   MRN: [id number]  HPI  Patient presents to clinic today for her annual exam. She is also concerned about unintentional weight loss.  She has lost about 14 pounds in the last 8 months.  She does attribute this to loss of appetite.  She denies any anxiety or depression.  She does not smoke.  She has not noticed any vaginal bleeding or blood in her urine.  She does have a mammogram scheduled.  Flu: 08/2024 Tetanus: 07/2021 COVID: x 3 Pneumovax: Never Prevnar 20: 07/2021 Shingrix: Never Pap smear: 03/2018 Mammogram: 08/2018 Bone density: 08/2018 Colon screening: 08/2022 Vision screening: annually Dentist: as needed  Diet: She does eat meat. She consumes fruits and veggies. She does eat fried foods. She drinks mostly water. Exercise: Walking  Review of Systems     Past Medical History:  Diagnosis Date   Hypertension     Current Outpatient Medications  Medication Sig Dispense Refill   amLODipine  (NORVASC ) 10 MG tablet TAKE 1 TABLET BY MOUTH EVERY DAY 90 tablet 0   atorvastatin  (LIPITOR) 10 MG tablet TAKE 1 TABLET BY MOUTH EVERY DAY 90 tablet 1   cholecalciferol (VITAMIN D) 1000 units tablet Take 1,000 Units by mouth daily.     hydrochlorothiazide  (HYDRODIURIL ) 50 MG tablet TAKE 1 TABLET BY MOUTH EVERY DAY 90 tablet 0   latanoprost (XALATAN) 0.005 % ophthalmic solution Place 1 drop into both eyes at bedtime.     meloxicam  (MOBIC ) 15 MG tablet TAKE 1 TABLET (15 MG TOTAL) BY MOUTH DAILY. 90 tablet 0   metFORMIN  (GLUCOPHAGE ) 500 MG tablet TAKE 1 TABLET BY MOUTH 2 TIMES DAILY WITH A MEAL. 180 tablet 0   No current facility-administered medications for this visit.    Allergies  Allergen Reactions   Chloroquine Itching   Propofol      Patient had prolonged fatigue, weakness several weeks after propofol  use for colonoscopy 2020. - Patient is not truly allergic, but this med should be used with caution in  future    Family History  Problem Relation Age of Onset   Hypertension Mother    Stroke Sister    Breast cancer Neg Hx     Social History   Socioeconomic History   Marital status: Married    Spouse name: Oti   Number of children: 5   Years of education: Not on file   Highest education level: Not on file  Occupational History   Not on file  Tobacco Use   Smoking status: Never   Smokeless tobacco: Never  Vaping Use   Vaping status: Never Used  Substance and Sexual Activity   Alcohol use: No   Drug use: No   Sexual activity: Yes    Birth control/protection: Post-menopausal  Other Topics Concern   Not on file  Social History Narrative   09/01/24 takes care of granddaughter/pbt   Social Drivers of Health   Tobacco Use: Low Risk (09/01/2024)   Patient History    Smoking Tobacco Use: Never    Smokeless Tobacco Use: Never    Passive Exposure: Not on file  Financial Resource Strain: Not on file  Food Insecurity: No Food Insecurity (09/01/2024)   Epic    Worried About Programme Researcher, Broadcasting/film/video in the Last Year: Never true    Ran Out of Food in the Last Year: Never true  Transportation Needs: No Transportation Needs (09/01/2024)   Epic  Lack of Transportation (Medical): No    Lack of Transportation (Non-Medical): No  Physical Activity: Inactive (09/01/2024)   Exercise Vital Sign    Days of Exercise per Week: 0 days    Minutes of Exercise per Session: 0 min  Stress: No Stress Concern Present (09/01/2024)   [employer] of Occupational Health - Occupational Stress Questionnaire    Feeling of Stress: Not at all  Social Connections: Moderately Integrated (09/01/2024)   Social Connection and Isolation Panel    Frequency of Communication with Friends and Family: More than three times a week    Frequency of Social Gatherings with Friends and Family: More than three times a week    Attends Religious Services: More than 4 times per year    Active Member of [employer] or Organizations:  No    Attends Banker Meetings: Never    Marital Status: Married  Catering Manager Violence: Not At Risk (09/01/2024)   Epic    Fear of Current or Ex-Partner: No    Emotionally Abused: No    Physically Abused: No    Sexually Abused: No  Depression (PHQ2-9): Medium Risk (09/01/2024)   Depression (PHQ2-9)    PHQ-2 Score: 6  Alcohol Screen: Not on file  Housing: Low Risk (09/01/2024)   Epic    Unable to Pay for Housing in the Last Year: No    Number of Times Moved in the Last Year: 0    Homeless in the Last Year: No  Utilities: Not At Risk (09/01/2024)   Epic    Threatened with loss of utilities: No  Health Literacy: Adequate Health Literacy (09/01/2024)   B1300 Health Literacy    Frequency of need for help with medical instructions: Never     Constitutional: Patient reports unintentional weight loss.  Denies fever, malaise, fatigue, headache.  HEENT: Denies eye pain, eye redness, ear pain, ringing in the ears, wax buildup, runny nose, nasal congestion, bloody nose, or sore throat. Respiratory: Denies difficulty breathing, shortness of breath, cough or sputum production.   Cardiovascular: Denies chest pain, chest tightness, palpitations or swelling in the hands or feet.  Gastrointestinal: Denies abdominal pain, bloating, constipation, diarrhea or blood in the stool.  GU: Denies urgency, frequency, pain with urination, burning sensation, blood in urine, odor or discharge. Musculoskeletal: Patient reports intermittent back pain.  Denies decrease in range of motion, difficulty with gait, or joint swelling.  Skin: Denies redness, rashes, lesions or ulcercations.  Neurological:  Denies dizziness, difficulty with memory, difficulty with speech or problems with balance and coordination.  Psych: Denies anxiety, depression, SI/HI.  No other specific complaints in a complete review of systems (except as listed in HPI above).  Objective:   Physical Exam  BP 112/68 (BP Location:  Right Arm, Patient Position: Sitting, Cuff Size: Normal)   Ht 5' 6 (1.676 m)   Wt 169 lb 3.2 oz (76.7 kg)   BMI 27.31 kg/m     Wt Readings from Last 3 Encounters:  09/01/24 175 lb 3.2 oz (79.5 kg)  03/05/24 183 lb 3.2 oz (83.1 kg)  08/19/23 185 lb 6.4 oz (84.1 kg)    General: Appears her stated age, overweight, in NAD. Skin: Warm, dry and intact.  No ulcerations noted. HEENT: Head: normal shape and size; Eyes: sclera white, no icterus, conjunctiva pink, PERRLA and EOMs intact;  Neck:  Neck supple, trachea midline. No masses, lumps or thyromegaly present.  Cardiovascular: Normal rate and rhythm. S1,S2 noted.  No murmur, rubs or gallops  noted. No JVD or BLE edema. No carotid bruits noted. Pulmonary/Chest: Normal effort and positive vesicular breath sounds. No respiratory distress. No wheezes, rales or ronchi noted.  Abdomen: Soft and nontender. Normal bowel sounds.  Musculoskeletal:  Strength 5/5 BUE.  Strength 5/5 RLE, 4/5 LLE.  No difficulty with gait.  Neurological: Alert and oriented. Cranial nerves II-XII grossly intact. Coordination normal.  Psychiatric: Mood and affect normal. Behavior is normal. Judgment and thought content normal.     BMET    Component Value Date/Time   NA 140 03/05/2024 0918   K 3.7 03/05/2024 0918   CL 99 03/05/2024 0918   CO2 28 03/05/2024 0918   GLUCOSE 120 03/05/2024 0918   BUN 12 03/05/2024 0918   CREATININE 0.90 03/05/2024 0918   CALCIUM  10.0 03/05/2024 0918   GFRNONAA 60 07/04/2020 0837   GFRAA 70 07/04/2020 0837    Lipid Panel     Component Value Date/Time   CHOL 213 (H) 03/05/2024 0918   TRIG 66 03/05/2024 0918   HDL 74 03/05/2024 0918   CHOLHDL 2.9 03/05/2024 0918   VLDL 20 01/08/2017 0904   LDLCALC 123 (H) 03/05/2024 0918    CBC    Component Value Date/Time   WBC 7.1 03/05/2024 0918   RBC 5.07 03/05/2024 0918   HGB 14.1 03/05/2024 0918   HCT 45.4 (H) 03/05/2024 0918   PLT 252 03/05/2024 0918   MCV 89.5 03/05/2024 0918    MCH 27.8 03/05/2024 0918   MCHC 31.1 (L) 03/05/2024 0918   RDW 14.2 03/05/2024 0918   LYMPHSABS 2,227 07/04/2020 0837   MONOABS 0.4 07/21/2018 1157   EOSABS 128 07/04/2020 0837   BASOSABS 43 07/04/2020 0837    Hgb A1C Lab Results  Component Value Date   HGBA1C 7.3 (H) 03/05/2024          Assessment & Plan:   Preventative Health Maintenance:  Flu shot UTD Tetanus UTD Encouraged her to get her COVID-vaccine Prevnar UTD, will not need Pneumovax Discussed Shingrix vaccine, she will check coverage with her insurance company and schedule a nurse visit if she would like to have this done She no longer needs to screen for cervical cancer Mammogram and bone density have been scheduled Colon screening UTD Encouraged her to consume a balanced diet and exercise regimen Advised her to see an eye doctor and dentist annually We will check CBC, c-Met, lipid, A1c today  Unintentional weight loss:  Will check TSH She does not smoke so we will hold off on chest x-ray at this time Mammogram has been scheduled  RTC in 6 months, follow-up chronic conditions Angeline Laura, NP  "

## 2024-09-23 ENCOUNTER — Ambulatory Visit: Payer: Self-pay | Admitting: Internal Medicine

## 2024-10-05 ENCOUNTER — Encounter

## 2024-10-05 ENCOUNTER — Other Ambulatory Visit

## 2025-03-24 ENCOUNTER — Ambulatory Visit: Admitting: Internal Medicine

## 2025-09-14 ENCOUNTER — Ambulatory Visit
